# Patient Record
Sex: Male | Born: 1950 | Race: White | Hispanic: No | Smoking: Current every day smoker
Health system: Southern US, Community
[De-identification: ages and names within clinical notes are randomized; demographics above are authoritative.]

## PROBLEM LIST (undated history)

## (undated) DIAGNOSIS — E7800 Pure hypercholesterolemia, unspecified: Secondary | ICD-10-CM

## (undated) DIAGNOSIS — I10 Essential (primary) hypertension: Secondary | ICD-10-CM

## (undated) DIAGNOSIS — I219 Acute myocardial infarction, unspecified: Secondary | ICD-10-CM

## (undated) DIAGNOSIS — I4891 Unspecified atrial fibrillation: Secondary | ICD-10-CM

## (undated) HISTORY — PX: COLONOSCOPY: SHX174

## (undated) HISTORY — PX: CATARACT EXTRACTION, BILATERAL: SHX1313

## (undated) HISTORY — DX: Essential (primary) hypertension: I10

## (undated) HISTORY — DX: Acute myocardial infarction, unspecified: I21.9

## (undated) HISTORY — DX: Pure hypercholesterolemia, unspecified: E78.00

## (undated) HISTORY — DX: Unspecified atrial fibrillation: I48.91

## (undated) HISTORY — PX: NECK SURGERY: SHX720

## (undated) HISTORY — PX: CARDIAC CATHETERIZATION: SHX172

## (undated) HISTORY — PX: BACK SURGERY: SHX140

---

## 2016-10-15 ENCOUNTER — Encounter (INDEPENDENT_AMBULATORY_CARE_PROVIDER_SITE_OTHER): Payer: Self-pay | Admitting: Internal Medicine

## 2016-10-15 ENCOUNTER — Encounter (INDEPENDENT_AMBULATORY_CARE_PROVIDER_SITE_OTHER): Payer: Self-pay

## 2016-10-27 ENCOUNTER — Other Ambulatory Visit (INDEPENDENT_AMBULATORY_CARE_PROVIDER_SITE_OTHER): Payer: Self-pay | Admitting: Internal Medicine

## 2016-10-27 ENCOUNTER — Encounter (INDEPENDENT_AMBULATORY_CARE_PROVIDER_SITE_OTHER): Payer: Self-pay | Admitting: *Deleted

## 2016-10-27 ENCOUNTER — Encounter (INDEPENDENT_AMBULATORY_CARE_PROVIDER_SITE_OTHER): Payer: Self-pay | Admitting: Internal Medicine

## 2016-10-27 ENCOUNTER — Ambulatory Visit (INDEPENDENT_AMBULATORY_CARE_PROVIDER_SITE_OTHER): Payer: No Typology Code available for payment source | Admitting: Internal Medicine

## 2016-10-27 VITALS — BP 124/64 | HR 60 | Temp 98.3°F | Ht 70.0 in | Wt 184.0 lb

## 2016-10-27 DIAGNOSIS — K625 Hemorrhage of anus and rectum: Secondary | ICD-10-CM

## 2016-10-27 DIAGNOSIS — K6289 Other specified diseases of anus and rectum: Secondary | ICD-10-CM

## 2016-10-27 DIAGNOSIS — I219 Acute myocardial infarction, unspecified: Secondary | ICD-10-CM

## 2016-10-27 DIAGNOSIS — I10 Essential (primary) hypertension: Secondary | ICD-10-CM

## 2016-10-27 DIAGNOSIS — I4891 Unspecified atrial fibrillation: Secondary | ICD-10-CM

## 2016-10-27 DIAGNOSIS — E7800 Pure hypercholesterolemia, unspecified: Secondary | ICD-10-CM

## 2016-10-27 HISTORY — DX: Essential (primary) hypertension: I10

## 2016-10-27 HISTORY — DX: Acute myocardial infarction, unspecified: I21.9

## 2016-10-27 HISTORY — DX: Unspecified atrial fibrillation: I48.91

## 2016-10-27 HISTORY — DX: Pure hypercholesterolemia, unspecified: E78.00

## 2016-10-27 NOTE — Patient Instructions (Signed)
The risks and benefits such as perforation, bleeding, and infection were reviewed with the patient and is agreeable. 

## 2016-10-27 NOTE — Telephone Encounter (Signed)
Patient needs trilyte 

## 2016-10-27 NOTE — Telephone Encounter (Signed)
This encounter was created in error - please disregard.

## 2016-10-27 NOTE — Progress Notes (Addendum)
   Subjective:    Patient ID: Bradley Mullins, male    DOB: October 31, 1950, 66 y.o.   MRN: [id number]  HPI Referred by Dr Merleen Milliner for change in stool x 1 month. He says he is having problems with his BMs. It hurts when he wipes. He has seen blood in his stool but not recently. Last blood was about 3 weeks ago. Stools are normal size.  His last colonoscopy was in 2011 ago by Dr. Aleene Davidson. 3 polyps from sigmoid.  Biopsy: Submucosal lipoma He has a BM x 1-2 a day. Normal size. Stools are brown in color.  Using a stool softener twice a day.  Has lost about 10 pounds over the past year. Weight loss was intentional per patient. Wife states weight loss was not intentional.  Mother had colon polyps.     05/20/2016  H and H: 14.9 and 44.4  Atrial fib and maintained on Xarelto.  No family hx of colon cancer.  Review of Systems  Past Medical History:  Diagnosis Date  . Acute myocardial infarction 10/27/2016   2012 with 3 stents  . Atrial fibrillation (HCC) 10/27/2016  . Essential hypertension, benign 10/27/2016  . High cholesterol 10/27/2016    No past surgical history on file.  Allergies no known allergies  No current outpatient prescriptions on file prior to visit.   No current facility-administered medications on file prior to visit.          Objective:   Physical Exam Blood pressure 124/64, pulse 60, temperature 98.3 F (36.8 C), height 5\' 10"  (1.778 m), weight 184 lb (83.5 kg). Alert and oriented. Skin warm and dry. Oral mucosa is moist.   . Sclera anicteric, conjunctivae is pink. Thyroid not enlarged. No cervical lymphadenopathy. Lungs clear. Heart regular rate and rhythm.  Abdomen is soft. Bowel sounds are positive. No hepatomegaly. No abdominal masses felt. No tenderness.  No edema to lower extremities.  Stool brown and guaiac positive.         Assessment & Plan:  Change in stool. Rectal bleeding. Colonic neoplasm needs to be ruled out. Colonoscopy. The risks and benefits such as  perforation, bleeding, and infection were reviewed with the patient and is agreeable.

## 2016-10-28 ENCOUNTER — Telehealth (INDEPENDENT_AMBULATORY_CARE_PROVIDER_SITE_OTHER): Payer: Self-pay | Admitting: *Deleted

## 2016-10-28 DIAGNOSIS — K6289 Other specified diseases of anus and rectum: Secondary | ICD-10-CM | POA: Insufficient documentation

## 2016-10-28 DIAGNOSIS — K625 Hemorrhage of anus and rectum: Secondary | ICD-10-CM | POA: Insufficient documentation

## 2016-10-28 NOTE — Telephone Encounter (Signed)
Per Dr Daryel November it is ok for patient to stop Xarelto & ASA 2 days prior to TCS sch'd 11/12/16, patient aware

## 2016-10-31 ENCOUNTER — Encounter (INDEPENDENT_AMBULATORY_CARE_PROVIDER_SITE_OTHER): Payer: Self-pay

## 2016-11-12 ENCOUNTER — Encounter (HOSPITAL_COMMUNITY): Payer: Self-pay | Admitting: *Deleted

## 2016-11-12 ENCOUNTER — Encounter (INDEPENDENT_AMBULATORY_CARE_PROVIDER_SITE_OTHER): Payer: Self-pay | Admitting: Internal Medicine

## 2016-11-12 ENCOUNTER — Encounter (HOSPITAL_COMMUNITY)
Admission: RE | Disposition: A | Discharge status: Home / Self Care | Payer: Self-pay | Source: Ambulatory Visit | Attending: Internal Medicine

## 2016-11-12 ENCOUNTER — Ambulatory Visit (HOSPITAL_COMMUNITY)
Admission: RE | Admit: 2016-11-12 | Discharge: 2016-11-12 | Disposition: A | Discharge status: Home / Self Care | Payer: No Typology Code available for payment source | Source: Ambulatory Visit | Attending: Internal Medicine | Admitting: Internal Medicine

## 2016-11-12 DIAGNOSIS — Z7982 Long term (current) use of aspirin: Secondary | ICD-10-CM | POA: Insufficient documentation

## 2016-11-12 DIAGNOSIS — I4891 Unspecified atrial fibrillation: Secondary | ICD-10-CM | POA: Insufficient documentation

## 2016-11-12 DIAGNOSIS — K644 Residual hemorrhoidal skin tags: Secondary | ICD-10-CM | POA: Diagnosis not present

## 2016-11-12 DIAGNOSIS — I252 Old myocardial infarction: Secondary | ICD-10-CM | POA: Diagnosis not present

## 2016-11-12 DIAGNOSIS — I10 Essential (primary) hypertension: Secondary | ICD-10-CM | POA: Insufficient documentation

## 2016-11-12 DIAGNOSIS — E7800 Pure hypercholesterolemia, unspecified: Secondary | ICD-10-CM | POA: Insufficient documentation

## 2016-11-12 DIAGNOSIS — K602 Anal fissure, unspecified: Secondary | ICD-10-CM | POA: Diagnosis not present

## 2016-11-12 DIAGNOSIS — K6289 Other specified diseases of anus and rectum: Secondary | ICD-10-CM

## 2016-11-12 DIAGNOSIS — K5730 Diverticulosis of large intestine without perforation or abscess without bleeding: Secondary | ICD-10-CM | POA: Insufficient documentation

## 2016-11-12 DIAGNOSIS — Z79899 Other long term (current) drug therapy: Secondary | ICD-10-CM | POA: Insufficient documentation

## 2016-11-12 DIAGNOSIS — Z7901 Long term (current) use of anticoagulants: Secondary | ICD-10-CM | POA: Insufficient documentation

## 2016-11-12 DIAGNOSIS — Z955 Presence of coronary angioplasty implant and graft: Secondary | ICD-10-CM | POA: Insufficient documentation

## 2016-11-12 DIAGNOSIS — K921 Melena: Secondary | ICD-10-CM | POA: Diagnosis not present

## 2016-11-12 DIAGNOSIS — K625 Hemorrhage of anus and rectum: Secondary | ICD-10-CM

## 2016-11-12 DIAGNOSIS — F17210 Nicotine dependence, cigarettes, uncomplicated: Secondary | ICD-10-CM | POA: Insufficient documentation

## 2016-11-12 HISTORY — PX: COLONOSCOPY: SHX5424

## 2016-11-12 SURGERY — COLONOSCOPY
Anesthesia: Moderate Sedation

## 2016-11-12 MED ORDER — MIDAZOLAM HCL 5 MG/5ML IJ SOLN
INTRAMUSCULAR | Status: DC | PRN
  Administered 2016-11-12 (×2): 2 mg via INTRAVENOUS
  Administered 2016-11-12 (×2): 1 mg via INTRAVENOUS

## 2016-11-12 MED ORDER — MIDAZOLAM HCL 5 MG/5ML IJ SOLN
INTRAMUSCULAR | Status: AC
  Filled 2016-11-12: qty 10

## 2016-11-12 MED ORDER — MEPERIDINE HCL 50 MG/ML IJ SOLN
INTRAMUSCULAR | Status: DC | PRN
  Administered 2016-11-12 (×2): 25 mg via INTRAVENOUS

## 2016-11-12 MED ORDER — LIDOCAINE HCL 2 % EX GEL
CUTANEOUS | Status: AC
  Filled 2016-11-12: qty 30

## 2016-11-12 MED ORDER — LIDOCAINE HCL 2 % EX GEL
CUTANEOUS | Status: DC | PRN
  Administered 2016-11-12: 1 via TOPICAL

## 2016-11-12 MED ORDER — SIMETHICONE 40 MG/0.6ML PO SUSP
ORAL | Status: DC | PRN
  Administered 2016-11-12: 13:00:00

## 2016-11-12 MED ORDER — DILTIAZEM GEL 2 %: 1.0000 "application " | Freq: Two times a day (BID) | CUTANEOUS | 1 refills | Status: AC

## 2016-11-12 MED ORDER — SODIUM CHLORIDE 0.9 % IV SOLN
INTRAVENOUS | Status: DC
  Administered 2016-11-12: 12:00:00 via INTRAVENOUS

## 2016-11-12 MED ORDER — MEPERIDINE HCL 50 MG/ML IJ SOLN
INTRAMUSCULAR | Status: AC
  Filled 2016-11-12: qty 1

## 2016-11-12 NOTE — Discharge Instructions (Signed)
Anal Fissure, Adult An anal fissure is a small tear or crack in the skin around the opening of the butt (anus).Bleeding from the tear or crack usually stops on its own within a few minutes. The bleeding may happen every time you poop (have a bowel movement) until the tear or crack heals. Follow these instructions at home: Eating and drinking   Avoid bananas and dairy products. These foods can make it hard to poop.  Drink enough fluid to keep your pee (urine) clear or pale yellow.  Eat a lot of fruit, whole grains, and vegetables. General instructions   Keep the butt area as clean and dry as you can.  Take a warm water bath (sitz bath) as told by your doctor. Do not use soap.  Take over-the-counter and prescription medicines only as told by your doctor.  Use creams or ointments only as told by your doctor.  Keep all follow-up visits as told by your doctor. This is important. Contact a doctor if:  You have more bleeding.  You have a fever.  You have watery poop (diarrhea) that is mixed with blood.  You have pain.  You problem gets worse, not better. This information is not intended to replace advice given to you by your health care provider. Make sure you discuss any questions you have with your health care provider. Document Released: 04/30/2011 Document Revised: 02/07/2016 Document Reviewed: 11/27/2014 Elsevier Interactive Patient Education  2017 [employer]. Resume usual medications including aspirin and Xarelto as before. High fiber diet. Diltiazem gel apply to anal canal twice daily as directed. Benefiber or equivalent 4 g by mouth daily at bedtime. Sitz bath daily. Office visit in one month.     High-Fiber Diet Fiber, also called dietary fiber, is a type of carbohydrate found in fruits, vegetables, whole grains, and beans. A high-fiber diet can have many health benefits. Your health care provider may recommend a high-fiber diet to help:  Prevent  constipation. Fiber can make your bowel movements more regular.  Lower your cholesterol.  Relieve hemorrhoids, uncomplicated diverticulosis, or irritable bowel syndrome.  Prevent overeating as part of a weight-loss plan.  Prevent heart disease, type 2 diabetes, and certain cancers. What is my plan? The recommended daily intake of fiber includes:  38 grams for men under age 73.  30 grams for men over age 74.  25 grams for women under age 9.  21 grams for women over age 63. You can get the recommended daily intake of dietary fiber by eating a variety of fruits, vegetables, grains, and beans. Your health care provider may also recommend a fiber supplement if it is not possible to get enough fiber through your diet. What do I need to know about a high-fiber diet?  Fiber supplements have not been widely studied for their effectiveness, so it is better to get fiber through food sources.  Always check the fiber content on thenutrition facts label of any prepackaged food. Look for foods that contain at least 5 grams of fiber per serving.  Ask your dietitian if you have questions about specific foods that are related to your condition, especially if those foods are not listed in the following section.  Increase your daily fiber consumption gradually. Increasing your intake of dietary fiber too quickly may cause bloating, cramping, or gas.  Drink plenty of water. Water helps you to digest fiber. What foods can I eat? Grains  Whole-grain breads. Multigrain cereal. Oats and oatmeal. Brown rice. Barley. Bulgur wheat.  Millet. Bran muffins. Popcorn. Rye wafer crackers. Vegetables  Sweet potatoes. Spinach. Kale. Artichokes. Cabbage. Broccoli. Green peas. Carrots. Squash. Fruits  Berries. Pears. Apples. Oranges. Avocados. Prunes and raisins. Dried figs. Meats and Other Protein Sources  Navy, kidney, pinto, and soy beans. Split peas. Lentils. Nuts and seeds. Dairy  Fiber-fortified  yogurt. Beverages  Fiber-fortified soy milk. Fiber-fortified orange juice. Other  Fiber bars. The items listed above may not be a complete list of recommended foods or beverages. Contact your dietitian for more options.  What foods are not recommended? Grains  White bread. Pasta made with refined flour. White rice. Vegetables  Fried potatoes. Canned vegetables. Well-cooked vegetables. Fruits  Fruit juice. Cooked, strained fruit. Meats and Other Protein Sources  Fatty cuts of meat. Fried Environmental education officer or fried fish. Dairy  Milk. Yogurt. Cream cheese. Sour cream. Beverages  Soft drinks. Other  Cakes and pastries. Butter and oils. The items listed above may not be a complete list of foods and beverages to avoid. Contact your dietitian for more information.  What are some tips for including high-fiber foods in my diet?  Eat a wide variety of high-fiber foods.  Make sure that half of all grains consumed each day are whole grains.  Replace breads and cereals made from refined flour or white flour with whole-grain breads and cereals.  Replace white rice with brown rice, bulgur wheat, or millet.  Start the day with a breakfast that is high in fiber, such as a cereal that contains at least 5 grams of fiber per serving.  Use beans in place of meat in soups, salads, or pasta.  Eat high-fiber snacks, such as berries, raw vegetables, nuts, or popcorn. This information is not intended to replace advice given to you by your health care provider. Make sure you discuss any questions you have with your health care provider. Document Released: 09/01/2005 Document Revised: 02/07/2016 Document Reviewed: 02/14/2014 Elsevier Interactive Patient Education  2017 [employer]  Colonoscopy, Adult, Care After This sheet gives you information about how to care for yourself after your procedure. Your health care provider may also give you more specific instructions. If you have problems or questions, contact  your health care provider. What can I expect after the procedure? After the procedure, it is common to have:  A small amount of blood in your stool for 24 hours after the procedure.  Some gas.  Mild abdominal cramping or bloating. Follow these instructions at home: General instructions    For the first 24 hours after the procedure:  Do not drive or use machinery.  Do not sign important documents.  Do not drink alcohol.  Do your regular daily activities at a slower pace than normal.  Eat soft, easy-to-digest foods.  Rest often.  Take over-the-counter or prescription medicines only as told by your health care provider.  It is up to you to get the results of your procedure. Ask your health care provider, or the department performing the procedure, when your results will be ready. Relieving cramping and bloating   Try walking around when you have cramps or feel bloated.  Apply heat to your abdomen as told by your health care provider. Use a heat source that your health care provider recommends, such as a moist heat pack or a heating pad.  Place a towel between your skin and the heat source.  Leave the heat on for 20-30 minutes.  Remove the heat if your skin turns bright red. This is especially important if you  are unable to feel pain, heat, or cold. You may have a greater risk of getting burned. Eating and drinking   Drink enough fluid to keep your urine clear or pale yellow.  Resume your normal diet as instructed by your health care provider. Avoid heavy or fried foods that are hard to digest.  Avoid drinking alcohol for as long as instructed by your health care provider. Contact a health care provider if:  You have blood in your stool 2-3 days after the procedure. Get help right away if:  You have more than a small spotting of blood in your stool.  You pass large blood clots in your stool.  Your abdomen is swollen.  You have nausea or vomiting.  You have a  fever.  You have increasing abdominal pain that is not relieved with medicine. This information is not intended to replace advice given to you by your health care provider. Make sure you discuss any questions you have with your health care provider. Document Released: 04/15/2004 Document Revised: 05/26/2016 Document Reviewed: 11/13/2015 Elsevier Interactive Patient Education  2017 [employer].

## 2016-11-12 NOTE — H&P (Signed)
Bradley Mullins is an 66 y.o. male.   Chief Complaint: Patient is here for colonoscopy. HPI: 66 year old Caucasian male who presents with few months history of hematochezia. He also complains of painful defecation. He states he had I&D for perirectal boil about 6 months ago. He denies chronic diarrhea or constipation. States he lost few pounds while he was on medication for migraine but he has stopped this medication. Last colonoscopy was in July 2011 with removal of small polyp. It was lipoma. He has been off anticoagulant for 2 days. Family history is negative for CRC but his mother had multiple colonic polyps.  Past Medical History:  Diagnosis Date  . Acute myocardial infarction 10/27/2016   2012 with 3 stents  . Atrial fibrillation (HCC) 10/27/2016  . Essential hypertension, benign 10/27/2016  . High cholesterol 10/27/2016    Past Surgical History:  Procedure Laterality Date  . BACK SURGERY    . CARDIAC CATHETERIZATION     2012 with 3 stents  . CATARACT EXTRACTION, BILATERAL    . COLONOSCOPY    . NECK SURGERY     25 yrs ago    Family History  Problem Relation Age of Onset  . Colon cancer Neg Hx    Social History:  reports that he has been smoking Cigarettes.  He has a 38.00 pack-year smoking history. He has never used smokeless tobacco. He reports that he drinks alcohol. He reports that he does not use drugs.  Allergies: No Known Allergies  Medications Prior to Admission  Medication Sig Dispense Refill  . amLODipine (NORVASC) 5 MG tablet Take 5 mg by mouth daily.    Marland Kitchen aspirin EC 81 MG tablet Take 81 mg by mouth daily.    Marland Kitchen atorvastatin (LIPITOR) 40 MG tablet Take 40 mg by mouth at bedtime.    Marland Kitchen ibuprofen (ADVIL,MOTRIN) 200 MG tablet Take 400 mg by mouth every 8 (eight) hours as needed for mild pain or moderate pain.    Marland Kitchen lisinopril (PRINIVIL,ZESTRIL) 40 MG tablet Take 40 mg by mouth daily.    . rivaroxaban (XARELTO) 20 MG TABS tablet Take 20 mg by mouth daily with supper.    .  sotalol (BETAPACE) 80 MG tablet Take 80 mg by mouth 2 (two) times daily.      No results found for this or any previous visit (from the past 48 hour(s)). No results found.  ROS  Blood pressure 138/64, pulse (!) 50, temperature 97.8 F (36.6 C), temperature source Oral, resp. rate 17, height 5\' 10"  (1.778 m), weight 184 lb (83.5 kg), SpO2 100 %. Physical Exam  Constitutional: He appears well-developed and well-nourished.  HENT:  Mouth/Throat: Oropharynx is clear and moist.  Eyes: Conjunctivae are normal. No scleral icterus.  Neck: No thyromegaly present.  Cardiovascular: Normal rate, regular rhythm and normal heart sounds.   No murmur heard. Respiratory: Effort normal and breath sounds normal.  GI:  Abdomen is symmetrical soft and nontender without organomegaly or masses.  Lymphadenopathy:    He has no cervical adenopathy.     Assessment/Plan Hematochezia and painful defecation. Diagnostic colonoscopy.  Lionel December, MD 11/12/2016, 12:58 PM

## 2016-11-12 NOTE — Op Note (Signed)
[facility] Patient Name: Bradley Mullins Procedure Date: 11/12/2016 12:44 PM MRN: [id number] Date of Birth: 18-Feb-1951 Attending MD: Lionel December , MD CSN: [id number] Age: 66 Admit Type: Outpatient Procedure:                Colonoscopy Indications:              Hematochezia, Rectal pain Providers:                Lionel December, MD, Loma Messing B. Patsy Lager, RN, Dyann Ruddle Referring MD:             Arlina Robes, MD Medicines:                Xylocaine Jelly applied to anal canal. Meperidine                            50 mg IV, Midazolam 6 mg IV Complications:            No immediate complications. Estimated Blood Loss:     Estimated blood loss: none. Procedure:                Pre-Anesthesia Assessment:                           - Prior to the procedure, a History and Physical                            was performed, and patient medications and                            allergies were reviewed. The patient's tolerance of                            previous anesthesia was also reviewed. The risks                            and benefits of the procedure and the sedation                            options and risks were discussed with the patient.                            All questions were answered, and informed consent                            was obtained. Prior Anticoagulants: The patient                            last took aspirin 3 days and Xarelto (rivaroxaban)                            2 days prior to the procedure. ASA Grade  Assessment: II - A patient with mild systemic                            disease. After reviewing the risks and benefits,                            the patient was deemed in satisfactory condition to                            undergo the procedure.                           After obtaining informed consent, the colonoscope                            was passed under direct vision.  Throughout the                            procedure, the patient's blood pressure, pulse, and                            oxygen saturations were monitored continuously. The                            [id number] ([id number]) was introduced through the                            anus and advanced to the the cecum, identified by                            appendiceal orifice and ileocecal valve. The                            colonoscopy was performed without difficulty. The                            patient tolerated the procedure well. The quality                            of the bowel preparation was good. The ileocecal                            valve, appendiceal orifice, and rectum were                            photographed. Scope In: 1:10:51 PM Scope Out: 1:25:39 PM Scope Withdrawal Time: 0 hours 5 minutes 13 seconds  Total Procedure Duration: 0 hours 14 minutes 48 seconds  Findings:      The digital rectal exam findings include {skipcreased sphincter tone}.      A few medium-mouthed diverticula were found in the sigmoid colon.      The exam was otherwise normal throughout the examined colon.      External hemorrhoids were found during retroflexion. The hemorrhoids       were small.  A small anal fissure was found in the anal canal. Impression:               - {skipcreased sphincter tone} found on digital                            rectal exam.                           - Diverticulosis in the sigmoid colon.                           - External hemorrhoids.                           - Anal fissure.                           - No specimens collected. Moderate Sedation:      Moderate (conscious) sedation was administered by the endoscopy nurse       and supervised by the endoscopist. The following parameters were       monitored: oxygen saturation, heart rate, blood pressure, CO2       capnography and response to care. Total physician intraservice time was       21  minutes. Recommendation:           - Patient has a contact number available for                            emergencies. The signs and symptoms of potential                            delayed complications were discussed with the                            patient. Return to normal activities tomorrow.                            Written discharge instructions were provided to the                            patient.                           - High fiber diet today.                           - Continue present medications.                           - Resume aspirin today and Xarelto (rivaroxaban)                            today at prior doses.                           - diltiazem gel 2% to be applied to anal canal  twice a day.                           - office visit in one month.                           - Repeat colonoscopy in 10 years for screening                            purposes. Procedure Code(s):        --- Professional ---                           [phone], Colonoscopy, flexible; diagnostic, including                            collection of specimen(s) by brushing or washing,                            when performed (separate procedure)                           99152, Moderate sedation services provided by the                            same physician or other qualified health care                            professional performing the diagnostic or                            therapeutic service that the sedation supports,                            requiring the presence of an independent trained                            observer to assist in the monitoring of the                            patient's level of consciousness and physiological                            status; initial 15 minutes of intraservice time,                            patient age 76 years or older Diagnosis Code(s):        --- Professional ---                            K64.4, Residual hemorrhoidal skin tags                           K60.2, Anal fissure, unspecified  K92.1, Melena (includes Hematochezia)                           K62.89, Other specified diseases of anus and rectum                           K57.30, Diverticulosis of large intestine without                            perforation or abscess without bleeding CPT copyright 2016 American Medical Association. All rights reserved. The codes documented in this report are preliminary and upon coder review may  be revised to meet current compliance requirements. Lionel December, MD Lionel December, MD 11/12/2016 1:37:58 PM This report has been signed electronically. Number of Addenda: 0

## 2016-11-12 NOTE — Progress Notes (Signed)
Marylen Ponto had a procedure at [facility] on   11/12/2016.  He may return to work on Friday 11/14/2016.

## 2016-11-14 ENCOUNTER — Encounter (HOSPITAL_COMMUNITY): Payer: Self-pay | Admitting: Internal Medicine

## 2016-12-10 ENCOUNTER — Ambulatory Visit (INDEPENDENT_AMBULATORY_CARE_PROVIDER_SITE_OTHER): Payer: No Typology Code available for payment source | Admitting: Internal Medicine

## 2020-07-06 ENCOUNTER — Emergency Department (HOSPITAL_COMMUNITY): Payer: No Typology Code available for payment source

## 2020-07-06 ENCOUNTER — Emergency Department (HOSPITAL_COMMUNITY)
Admission: EM | Admit: 2020-07-06 | Discharge: 2020-07-06 | Disposition: A | Discharge status: Home / Self Care | Payer: No Typology Code available for payment source | Attending: Emergency Medicine | Admitting: Emergency Medicine

## 2020-07-06 ENCOUNTER — Encounter (HOSPITAL_COMMUNITY): Payer: Self-pay | Admitting: Emergency Medicine

## 2020-07-06 ENCOUNTER — Other Ambulatory Visit: Payer: Self-pay

## 2020-07-06 DIAGNOSIS — Z7982 Long term (current) use of aspirin: Secondary | ICD-10-CM | POA: Diagnosis not present

## 2020-07-06 DIAGNOSIS — F17210 Nicotine dependence, cigarettes, uncomplicated: Secondary | ICD-10-CM | POA: Insufficient documentation

## 2020-07-06 DIAGNOSIS — D649 Anemia, unspecified: Secondary | ICD-10-CM

## 2020-07-06 DIAGNOSIS — R0602 Shortness of breath: Secondary | ICD-10-CM

## 2020-07-06 DIAGNOSIS — Z20822 Contact with and (suspected) exposure to covid-19: Secondary | ICD-10-CM | POA: Insufficient documentation

## 2020-07-06 DIAGNOSIS — I10 Essential (primary) hypertension: Secondary | ICD-10-CM | POA: Diagnosis not present

## 2020-07-06 DIAGNOSIS — Z7901 Long term (current) use of anticoagulants: Secondary | ICD-10-CM | POA: Diagnosis not present

## 2020-07-06 DIAGNOSIS — R42 Dizziness and giddiness: Secondary | ICD-10-CM | POA: Diagnosis not present

## 2020-07-06 DIAGNOSIS — Z79899 Other long term (current) drug therapy: Secondary | ICD-10-CM | POA: Insufficient documentation

## 2020-07-06 LAB — CBC WITH DIFFERENTIAL/PLATELET
Abs Immature Granulocytes: 0.03 10*3/uL (ref 0.00–0.07)
Basophils Absolute: 0.1 10*3/uL (ref 0.0–0.1)
Basophils Relative: 1 %
Eosinophils Absolute: 0.6 10*3/uL — ABNORMAL HIGH (ref 0.0–0.5)
Eosinophils Relative: 7 %
HCT: 32.8 % — ABNORMAL LOW (ref 39.0–52.0)
Hemoglobin: 8.7 g/dL — ABNORMAL LOW (ref 13.0–17.0)
Immature Granulocytes: 0 %
Lymphocytes Relative: 23 %
Lymphs Abs: 2.1 10*3/uL (ref 0.7–4.0)
MCH: 18.3 pg — ABNORMAL LOW (ref 26.0–34.0)
MCHC: 26.5 g/dL — ABNORMAL LOW (ref 30.0–36.0)
MCV: 69.1 fL — ABNORMAL LOW (ref 80.0–100.0)
Monocytes Absolute: 0.8 10*3/uL (ref 0.1–1.0)
Monocytes Relative: 9 %
Neutro Abs: 5.2 10*3/uL (ref 1.7–7.7)
Neutrophils Relative %: 60 %
Platelets: 286 10*3/uL (ref 150–400)
RBC: 4.75 MIL/uL (ref 4.22–5.81)
RDW: 20.6 % — ABNORMAL HIGH (ref 11.5–15.5)
WBC: 8.9 10*3/uL (ref 4.0–10.5)
nRBC: 0 % (ref 0.0–0.2)

## 2020-07-06 LAB — BASIC METABOLIC PANEL
Anion gap: 9 (ref 5–15)
BUN: 13 mg/dL (ref 8–23)
CO2: 27 mmol/L (ref 22–32)
Calcium: 9.1 mg/dL (ref 8.9–10.3)
Chloride: 100 mmol/L (ref 98–111)
Creatinine, Ser: 0.61 mg/dL (ref 0.61–1.24)
GFR, Estimated: >60 mL/min (ref >60)
Glucose, Bld: 120 mg/dL — ABNORMAL HIGH (ref 70–99)
Potassium: 3.9 mmol/L (ref 3.5–5.1)
Sodium: 136 mmol/L (ref 135–145)

## 2020-07-06 LAB — RESPIRATORY PANEL BY RT PCR (FLU A&B, COVID)
Influenza A by PCR: NEGATIVE
Influenza B by PCR: NEGATIVE
SARS Coronavirus 2 by RT PCR: NEGATIVE

## 2020-07-06 LAB — D-DIMER, QUANTITATIVE: D-Dimer, Quant: 0.65 ug/mL-FEU — ABNORMAL HIGH (ref 0.00–0.50)

## 2020-07-06 LAB — TROPONIN I (HIGH SENSITIVITY)
Troponin I (High Sensitivity): 4 ng/L (ref <18)
Troponin I (High Sensitivity): 4 ng/L (ref <18)

## 2020-07-06 LAB — BRAIN NATRIURETIC PEPTIDE: B Natriuretic Peptide: 367 pg/mL — ABNORMAL HIGH (ref 0.0–100.0)

## 2020-07-06 MED ORDER — FUROSEMIDE 10 MG/ML IJ SOLN
40.0000 mg | Freq: Once | INTRAMUSCULAR | Status: AC
  Administered 2020-07-06: 40 mg via INTRAVENOUS
  Filled 2020-07-06: qty 4

## 2020-07-06 MED ORDER — IOHEXOL 350 MG/ML SOLN
100.0000 mL | Freq: Once | INTRAVENOUS | Status: AC | PRN
  Administered 2020-07-06: 100 mL via INTRAVENOUS

## 2020-07-06 NOTE — ED Notes (Signed)
Pt to CT

## 2020-07-06 NOTE — ED Provider Notes (Signed)
[facility] EMERGENCY DEPARTMENT Provider Note   CSN: [id number] Arrival date & time: 07/06/20  1006     History Chief Complaint  Patient presents with  . Abnormal Lab    Bradley Mullins is a 69 y.o. male.  He has a history of A. fib and MI and is on anticoagulation.  Smoker.  Complaining of 2 weeks of increased shortness of breath especially with exertion.  No fevers or chills.  No chest pain.  He had some blood work done few days ago and was called by his doctor saying he needed to come to the emergency department for an elevated D-dimer.  I reviewed the paperwork and his D-dimer was 0.58.  He also says he gets lightheaded when he stands up and when he exerts himself.  The history is provided by the patient.  Shortness of Breath Severity:  Moderate Onset quality:  Gradual Duration:  2 weeks Timing:  Intermittent Progression:  Worsening Chronicity:  New Context: activity   Relieved by:  None tried Worsened by:  Activity Ineffective treatments:  Rest Associated symptoms: no abdominal pain, no chest pain, no cough, no fever, no neck pain, no rash, no sore throat, no sputum production, no vomiting and no wheezing   Risk factors: tobacco use        Past Medical History:  Diagnosis Date  . Acute myocardial infarction [facility]) 10/27/2016   2012 with 3 stents  . Atrial fibrillation (HCC) 10/27/2016  . Essential hypertension, benign 10/27/2016  . High cholesterol 10/27/2016    Patient Active Problem List   Diagnosis Date Noted  . Rectal pain 10/28/2016  . Rectal bleeding 10/28/2016  . Essential hypertension, benign 10/27/2016  . High cholesterol 10/27/2016  . Acute myocardial infarction (HCC) 10/27/2016  . Atrial fibrillation (HCC) 10/27/2016    Past Surgical History:  Procedure Laterality Date  . BACK SURGERY    . CARDIAC CATHETERIZATION     2012 with 3 stents  . CATARACT EXTRACTION, BILATERAL    . COLONOSCOPY    . COLONOSCOPY N/A 11/12/2016   Procedure: COLONOSCOPY;   Surgeon: Malissa Hippo, MD;  Location: [facility];  Service: Endoscopy;  Laterality: N/A;  12:45  . NECK SURGERY     25 yrs ago       Family History  Problem Relation Age of Onset  . Colon cancer Neg Hx     Social History   Tobacco Use  . Smoking status: Current Every Day Smoker    Packs/day: 1.00    Years: 38.00    Pack years: 38.00    Types: Cigarettes  . Smokeless tobacco: Never Used  Substance Use Topics  . Alcohol use: Yes    Comment: occasional beer.  . Drug use: No    Home Medications Prior to Admission medications   Medication Sig Start Date End Date Taking? Authorizing Provider  amLODipine (NORVASC) 5 MG tablet Take 5 mg by mouth daily.    [provider]  aspirin EC 81 MG tablet Take 81 mg by mouth daily.    [provider]  atorvastatin (LIPITOR) 40 MG tablet Take 40 mg by mouth at bedtime.    [provider]  diltiazem 2 % GEL Apply 1 application topically 2 (two) times daily. 11/12/16   Malissa Hippo, MD  ibuprofen (ADVIL,MOTRIN) 200 MG tablet Take 400 mg by mouth every 8 (eight) hours as needed for mild pain or moderate pain.    [provider]  lisinopril (PRINIVIL,ZESTRIL) 40  MG tablet Take 40 mg by mouth daily.    [provider]  rivaroxaban (XARELTO) 20 MG TABS tablet Take 20 mg by mouth daily with supper.    [provider]  sotalol (BETAPACE) 80 MG tablet Take 80 mg by mouth 2 (two) times daily.    [provider]    Allergies    Patient has no known allergies.  Review of Systems   Review of Systems  Constitutional: Negative for fever.  HENT: Negative for sore throat.   Eyes: Negative for visual disturbance.  Respiratory: Positive for shortness of breath. Negative for cough, sputum production and wheezing.   Cardiovascular: Negative for chest pain.  Gastrointestinal: Negative for abdominal pain and vomiting.  Genitourinary: Negative for dysuria.  Musculoskeletal: Negative for  neck pain.  Skin: Negative for rash.  Neurological: Positive for light-headedness.    Physical Exam Updated Vital Signs Temp 98 F (36.7 C) (Oral)   Resp 20   Ht 5\' 10"  (1.778 m)   Wt 81.6 kg   BMI 25.83 kg/m   Physical Exam Vitals and nursing note reviewed.  Constitutional:      General: He is not in acute distress.    Appearance: Normal appearance. He is well-developed.  HENT:     Head: Normocephalic and atraumatic.  Eyes:     Conjunctiva/sclera: Conjunctivae normal.  Cardiovascular:     Rate and Rhythm: Normal rate. Rhythm irregular.     Heart sounds: No murmur heard.   Pulmonary:     Effort: Pulmonary effort is normal. No respiratory distress.     Breath sounds: Normal breath sounds.  Abdominal:     Palpations: Abdomen is soft.     Tenderness: There is no abdominal tenderness.  Musculoskeletal:        General: Normal range of motion.     Cervical back: Neck supple.     Right lower leg: No edema.     Left lower leg: No edema.  Skin:    General: Skin is warm and dry.     Capillary Refill: Capillary refill takes less than 2 seconds.  Neurological:     General: No focal deficit present.     Mental Status: He is alert.     ED Results / Procedures / Treatments   Labs (all labs ordered are listed, but only abnormal results are displayed) Labs Reviewed  BASIC METABOLIC PANEL - Abnormal; Notable for the following components:      Result Value   Glucose, Bld 120 (*)    All other components within normal limits  CBC WITH DIFFERENTIAL/PLATELET - Abnormal; Notable for the following components:   Hemoglobin 8.7 (*)    HCT 32.8 (*)    MCV 69.1 (*)    MCH 18.3 (*)    MCHC 26.5 (*)    RDW 20.6 (*)    Eosinophils Absolute 0.6 (*)    All other components within normal limits  BRAIN NATRIURETIC PEPTIDE - Abnormal; Notable for the following components:   B Natriuretic Peptide 367.0 (*)    All other components within normal limits  D-DIMER, QUANTITATIVE (NOT AT [facility])  - Abnormal; Notable for the following components:   D-Dimer, Quant 0.65 (*)    All other components within normal limits  RESPIRATORY PANEL BY RT PCR (FLU A&B, COVID)  TROPONIN I (HIGH SENSITIVITY)  TROPONIN I (HIGH SENSITIVITY)    EKG EKG Interpretation  Date/Time:  Friday July 06 2020 10:25:38 EDT Ventricular Rate:  126 PR Interval:  QRS Duration: 99 QT Interval:  332 QTC Calculation: 487 R Axis:   -63 Text Interpretation: Atrial fibrillation Ventricular premature complex Inferior infarct, old Consider anterior infarct No old tracing to compare Confirmed by Meridee Score [phone]) on 07/06/2020 10:35:10 AM   Radiology CT Angio Chest PE W/Cm &/Or Wo Cm  Result Date: 07/06/2020 CLINICAL DATA:  Pulmonary embolism, dizziness, weakness, cough EXAM: CT ANGIOGRAPHY CHEST WITH CONTRAST TECHNIQUE: Multidetector CT imaging of the chest was performed using the standard protocol during bolus administration of intravenous contrast. Multiplanar CT image reconstructions and MIPs were obtained to evaluate the vascular anatomy. CONTRAST:  OMNIPAQUE IOHEXOL 350 MG/ML SOLN COMPARISON:  None. FINDINGS: Cardiovascular: There is excellent opacification of the pulmonary arterial tree. No intraluminal filling defect to suggest acute pulmonary embolism. Central pulmonary arterial caliber is normal. Minimal coronary artery calcification. Distal right coronary artery has undergone stenting. Global cardiac size within normal limits. Mild left ventricular hypertrophy. No pericardial effusion. Mild atherosclerotic calcification is seen within the a aortic arch. Thoracic aorta is otherwise unremarkable Mediastinum/Nodes: Thyroid unremarkable. No pathologic mediastinal adenopathy. Esophagus unremarkable. Lungs/Pleura: Mild centrilobular emphysema. There is bronchial wall thickening noted diffusely in keeping with airway inflammation no confluent pulmonary infiltrates or focal pulmonary nodule. No pneumothorax  or pleural effusion. No central obstructing mass. Upper Abdomen: No acute abnormality. Musculoskeletal: Cervical fusion hardware partially visualized. No acute bone abnormality. Review of the MIP images confirms the above findings. IMPRESSION: Mild centrilobular emphysema.  Superimposed airway inflammation. No pulmonary embolism. Aortic Atherosclerosis (ICD10-I70.0) and Emphysema (ICD10-J43.9). Electronically Signed   By: Helyn Numbers MD   On: 07/06/2020 15:34   DG Chest Port 1 View  Result Date: 07/06/2020 CLINICAL DATA:  Shortness of breath. EXAM: PORTABLE CHEST 1 VIEW COMPARISON:  None. FINDINGS: Borderline enlargement of the cardiac silhouette. Both lungs are clear. No visible pleural effusions or pneumothorax. The visualized skeletal structures are unremarkable. IMPRESSION: 1. No acute cardiopulmonary disease. 2. Borderline cardiomegaly. Electronically Signed   By: Feliberto Harts MD   On: 07/06/2020 10:59    Procedures Procedures (including critical care time)  Medications Ordered in ED Medications  furosemide (LASIX) injection 40 mg (40 mg Intravenous Given 07/06/20 1230)  iohexol (OMNIPAQUE) 350 MG/ML injection 100 mL (100 mLs Intravenous Contrast Given 07/06/20 1515)    ED Course  I have reviewed the triage vital signs and the nursing notes.  Pertinent labs & imaging results that were available during my care of the patient were reviewed by me and considered in my medical decision making (see chart for details).  Clinical Course as of Jul 07 1807  Fri Jul 06, 2020  1048 Chest x-ray showing no acute infiltrates, interpreted by me   [MB]  1258 Labs reviewed he is anemic.  He said his cardiologist told him that when he was seen a few days ago.  When I asked him what the work-up for that is he said his cardiologist wanted him to get the D-dimer figured out first before he had more testing for that.  BNP elevated.  No prior history of cardiac disease.  Troponin not elevated.   Patient denies any melena or objective bleeding.   [MB]  1350 Discussed with Dr. Daryel November cardiology out of [place].  He appreciated that we are seeing him and said he will follow up the patient.  I let him know that the patient may ultimately leave before he gets his CAT scan PE study done.   [MB]    Clinical Course User  Index [MB] Terrilee Files, MD   MDM Rules/Calculators/A&P                         Janmichael Giraud was evaluated in Emergency Department on 07/06/2020 for the symptoms described in the history of present illness. He was evaluated in the context of the global COVID-19 pandemic, which necessitated consideration that the patient might be at risk for infection with the SARS-CoV-2 virus that causes COVID-19. Institutional protocols and algorithms that pertain to the evaluation of patients at risk for COVID-19 are in a state of rapid change based on information released by regulatory bodies including the CDC and federal and state organizations. These policies and algorithms were followed during the patient's care in the ED.  This patient complains of elevated D-dimer and mild shortness of breath; this involves an extensive number of treatment Options and is a complaint that carries with it a high risk of complications and Morbidity. The differential includes PE, pneumonia, ACS, pneumothorax, Covid  I ordered, reviewed and interpreted labs, which included CBC with normal white count, low hemoglobin of 8.7 (reviewed prior hemoglobin from cardiology visit and similar although appears to be new for patient) chemistries fairly normal other than mild elevation of glucose, BNP elevated at 367 with no priors, D-dimer mildly elevated, troponins flat I ordered medication IV Lasix with good diuresis I ordered imaging studies which included chest x-ray and I independently    visualized and interpreted imaging which showed cardiomegaly no frank edema Additional history obtained from patient's  wife Previous records obtained and reviewed in epic, no recent visits usually follows out of [place] I consulted patient's cardiologist Daryel November and discussed lab and imaging findings  Critical Interventions: None  After the interventions stated above, I reevaluated the patient and found patient to be minimally symptomatic.  His CT unfortunately took a while but does not show any evidence of PE but does show some element of COPD.  Recommended he closely follow-up with his PCP and cardiologist regarding his respiratory symptoms and his new anemia.  Recommended he return to the emergency department if he notices any melena or any other frank bleeding.   Final Clinical Impression(s) / ED Diagnoses Final diagnoses:  Shortness of breath  Anemia, unspecified type    Rx / DC Orders ED Discharge Orders    None       Terrilee Files, MD 07/06/20 1811

## 2020-07-06 NOTE — ED Triage Notes (Signed)
Pt reports dizziness/shortness of breath for last several weeks. Pt had blood drawn on Tuesday and reports was called and told had possible blood clot in lung. Pt currently taking xarelto or any known injury. Pt reports chest pressure. nad noted.

## 2020-07-06 NOTE — Discharge Instructions (Signed)
You were seen in the emergency department for evaluation of shortness of breath.  Your blood work showed you to be anemic.  There is no evidence of heart attack.  Chest x-ray did not show a pneumonia and your Covid testing was negative.  Please continue your regular medications.  Follow-up with your primary care doctor and cardiologist for further work-up.  Return the emergency department for any worsening or concerning symptoms

## 2020-07-30 ENCOUNTER — Ambulatory Visit (INDEPENDENT_AMBULATORY_CARE_PROVIDER_SITE_OTHER): Payer: No Typology Code available for payment source | Admitting: Gastroenterology

## 2020-08-24 DIAGNOSIS — I4901 Ventricular fibrillation: Secondary | ICD-10-CM

## 2020-08-24 DIAGNOSIS — K922 Gastrointestinal hemorrhage, unspecified: Secondary | ICD-10-CM

## 2020-08-24 DIAGNOSIS — I482 Chronic atrial fibrillation, unspecified: Secondary | ICD-10-CM

## 2020-08-24 DIAGNOSIS — J698 Pneumonitis due to inhalation of other solids and liquids: Secondary | ICD-10-CM

## 2020-08-24 DIAGNOSIS — J9621 Acute and chronic respiratory failure with hypoxia: Secondary | ICD-10-CM

## 2020-08-24 DIAGNOSIS — I509 Heart failure, unspecified: Secondary | ICD-10-CM

## 2020-08-25 DIAGNOSIS — I4901 Ventricular fibrillation: Secondary | ICD-10-CM

## 2020-08-25 DIAGNOSIS — J9621 Acute and chronic respiratory failure with hypoxia: Secondary | ICD-10-CM

## 2020-08-25 DIAGNOSIS — I509 Heart failure, unspecified: Secondary | ICD-10-CM

## 2020-08-25 DIAGNOSIS — K922 Gastrointestinal hemorrhage, unspecified: Secondary | ICD-10-CM

## 2020-08-25 DIAGNOSIS — J698 Pneumonitis due to inhalation of other solids and liquids: Secondary | ICD-10-CM

## 2020-08-25 DIAGNOSIS — I482 Chronic atrial fibrillation, unspecified: Secondary | ICD-10-CM

## 2020-08-26 ENCOUNTER — Inpatient Hospital Stay
Admission: AD | Admit: 2020-08-26 | Payer: No Typology Code available for payment source | Source: Other Acute Inpatient Hospital | Admitting: Pulmonary Disease

## 2020-08-26 DIAGNOSIS — K922 Gastrointestinal hemorrhage, unspecified: Secondary | ICD-10-CM | POA: Diagnosis not present

## 2020-08-26 DIAGNOSIS — I509 Heart failure, unspecified: Secondary | ICD-10-CM

## 2020-08-26 DIAGNOSIS — J698 Pneumonitis due to inhalation of other solids and liquids: Secondary | ICD-10-CM | POA: Diagnosis not present

## 2020-08-26 DIAGNOSIS — I4901 Ventricular fibrillation: Secondary | ICD-10-CM | POA: Diagnosis not present

## 2020-08-26 DIAGNOSIS — I482 Chronic atrial fibrillation, unspecified: Secondary | ICD-10-CM

## 2020-08-26 DIAGNOSIS — J9621 Acute and chronic respiratory failure with hypoxia: Secondary | ICD-10-CM | POA: Diagnosis not present

## 2020-09-03 DIAGNOSIS — I509 Heart failure, unspecified: Secondary | ICD-10-CM

## 2020-09-03 DIAGNOSIS — I4901 Ventricular fibrillation: Secondary | ICD-10-CM | POA: Diagnosis not present

## 2020-09-03 DIAGNOSIS — I482 Chronic atrial fibrillation, unspecified: Secondary | ICD-10-CM

## 2020-09-03 DIAGNOSIS — K922 Gastrointestinal hemorrhage, unspecified: Secondary | ICD-10-CM | POA: Diagnosis not present

## 2020-09-03 DIAGNOSIS — J698 Pneumonitis due to inhalation of other solids and liquids: Secondary | ICD-10-CM | POA: Diagnosis not present

## 2020-09-03 DIAGNOSIS — J9621 Acute and chronic respiratory failure with hypoxia: Secondary | ICD-10-CM | POA: Diagnosis not present

## 2020-09-04 DIAGNOSIS — I482 Chronic atrial fibrillation, unspecified: Secondary | ICD-10-CM

## 2020-09-04 DIAGNOSIS — J698 Pneumonitis due to inhalation of other solids and liquids: Secondary | ICD-10-CM

## 2020-09-04 DIAGNOSIS — J9621 Acute and chronic respiratory failure with hypoxia: Secondary | ICD-10-CM

## 2020-09-04 DIAGNOSIS — K922 Gastrointestinal hemorrhage, unspecified: Secondary | ICD-10-CM

## 2020-09-04 DIAGNOSIS — I509 Heart failure, unspecified: Secondary | ICD-10-CM

## 2020-09-04 DIAGNOSIS — I4901 Ventricular fibrillation: Secondary | ICD-10-CM

## 2020-09-05 DIAGNOSIS — I4901 Ventricular fibrillation: Secondary | ICD-10-CM

## 2020-09-05 DIAGNOSIS — I509 Heart failure, unspecified: Secondary | ICD-10-CM

## 2020-09-05 DIAGNOSIS — J698 Pneumonitis due to inhalation of other solids and liquids: Secondary | ICD-10-CM

## 2020-09-05 DIAGNOSIS — K922 Gastrointestinal hemorrhage, unspecified: Secondary | ICD-10-CM

## 2020-09-05 DIAGNOSIS — I482 Chronic atrial fibrillation, unspecified: Secondary | ICD-10-CM

## 2020-09-05 DIAGNOSIS — J9621 Acute and chronic respiratory failure with hypoxia: Secondary | ICD-10-CM

## 2020-09-06 DIAGNOSIS — I482 Chronic atrial fibrillation, unspecified: Secondary | ICD-10-CM

## 2020-09-06 DIAGNOSIS — K922 Gastrointestinal hemorrhage, unspecified: Secondary | ICD-10-CM

## 2020-09-06 DIAGNOSIS — I509 Heart failure, unspecified: Secondary | ICD-10-CM

## 2020-09-06 DIAGNOSIS — J698 Pneumonitis due to inhalation of other solids and liquids: Secondary | ICD-10-CM

## 2020-09-06 DIAGNOSIS — I4901 Ventricular fibrillation: Secondary | ICD-10-CM

## 2020-09-06 DIAGNOSIS — J9621 Acute and chronic respiratory failure with hypoxia: Secondary | ICD-10-CM

## 2020-09-07 DIAGNOSIS — I509 Heart failure, unspecified: Secondary | ICD-10-CM

## 2020-09-07 DIAGNOSIS — I4901 Ventricular fibrillation: Secondary | ICD-10-CM | POA: Diagnosis not present

## 2020-09-07 DIAGNOSIS — J9621 Acute and chronic respiratory failure with hypoxia: Secondary | ICD-10-CM | POA: Diagnosis not present

## 2020-09-07 DIAGNOSIS — J698 Pneumonitis due to inhalation of other solids and liquids: Secondary | ICD-10-CM | POA: Diagnosis not present

## 2020-09-07 DIAGNOSIS — K922 Gastrointestinal hemorrhage, unspecified: Secondary | ICD-10-CM | POA: Diagnosis not present

## 2020-09-07 DIAGNOSIS — I482 Chronic atrial fibrillation, unspecified: Secondary | ICD-10-CM

## 2020-09-08 DIAGNOSIS — J9621 Acute and chronic respiratory failure with hypoxia: Secondary | ICD-10-CM | POA: Diagnosis not present

## 2020-09-08 DIAGNOSIS — I4901 Ventricular fibrillation: Secondary | ICD-10-CM | POA: Diagnosis not present

## 2020-09-08 DIAGNOSIS — I509 Heart failure, unspecified: Secondary | ICD-10-CM

## 2020-09-08 DIAGNOSIS — J698 Pneumonitis due to inhalation of other solids and liquids: Secondary | ICD-10-CM | POA: Diagnosis not present

## 2020-09-08 DIAGNOSIS — K922 Gastrointestinal hemorrhage, unspecified: Secondary | ICD-10-CM | POA: Diagnosis not present

## 2020-09-08 DIAGNOSIS — I482 Chronic atrial fibrillation, unspecified: Secondary | ICD-10-CM

## 2020-09-09 DIAGNOSIS — I4901 Ventricular fibrillation: Secondary | ICD-10-CM | POA: Diagnosis not present

## 2020-09-09 DIAGNOSIS — I482 Chronic atrial fibrillation, unspecified: Secondary | ICD-10-CM

## 2020-09-09 DIAGNOSIS — I509 Heart failure, unspecified: Secondary | ICD-10-CM

## 2020-09-09 DIAGNOSIS — J9621 Acute and chronic respiratory failure with hypoxia: Secondary | ICD-10-CM | POA: Diagnosis not present

## 2020-09-09 DIAGNOSIS — J698 Pneumonitis due to inhalation of other solids and liquids: Secondary | ICD-10-CM | POA: Diagnosis not present

## 2020-09-09 DIAGNOSIS — K922 Gastrointestinal hemorrhage, unspecified: Secondary | ICD-10-CM | POA: Diagnosis not present

## 2020-09-17 DIAGNOSIS — I4901 Ventricular fibrillation: Secondary | ICD-10-CM | POA: Diagnosis not present

## 2020-09-17 DIAGNOSIS — K922 Gastrointestinal hemorrhage, unspecified: Secondary | ICD-10-CM | POA: Diagnosis not present

## 2020-09-17 DIAGNOSIS — I509 Heart failure, unspecified: Secondary | ICD-10-CM

## 2020-09-17 DIAGNOSIS — J698 Pneumonitis due to inhalation of other solids and liquids: Secondary | ICD-10-CM | POA: Diagnosis not present

## 2020-09-17 DIAGNOSIS — J9621 Acute and chronic respiratory failure with hypoxia: Secondary | ICD-10-CM | POA: Diagnosis not present

## 2020-09-17 DIAGNOSIS — I482 Chronic atrial fibrillation, unspecified: Secondary | ICD-10-CM

## 2020-09-18 DIAGNOSIS — K922 Gastrointestinal hemorrhage, unspecified: Secondary | ICD-10-CM | POA: Diagnosis not present

## 2020-09-18 DIAGNOSIS — J698 Pneumonitis due to inhalation of other solids and liquids: Secondary | ICD-10-CM | POA: Diagnosis not present

## 2020-09-18 DIAGNOSIS — I509 Heart failure, unspecified: Secondary | ICD-10-CM

## 2020-09-18 DIAGNOSIS — J9621 Acute and chronic respiratory failure with hypoxia: Secondary | ICD-10-CM | POA: Diagnosis not present

## 2020-09-18 DIAGNOSIS — I482 Chronic atrial fibrillation, unspecified: Secondary | ICD-10-CM

## 2020-09-18 DIAGNOSIS — I4901 Ventricular fibrillation: Secondary | ICD-10-CM | POA: Diagnosis not present

## 2020-09-19 DIAGNOSIS — J9621 Acute and chronic respiratory failure with hypoxia: Secondary | ICD-10-CM | POA: Diagnosis not present

## 2020-09-19 DIAGNOSIS — I4901 Ventricular fibrillation: Secondary | ICD-10-CM | POA: Diagnosis not present

## 2020-09-19 DIAGNOSIS — I482 Chronic atrial fibrillation, unspecified: Secondary | ICD-10-CM

## 2020-09-19 DIAGNOSIS — I509 Heart failure, unspecified: Secondary | ICD-10-CM

## 2020-09-19 DIAGNOSIS — J698 Pneumonitis due to inhalation of other solids and liquids: Secondary | ICD-10-CM | POA: Diagnosis not present

## 2020-09-19 DIAGNOSIS — K922 Gastrointestinal hemorrhage, unspecified: Secondary | ICD-10-CM | POA: Diagnosis not present

## 2020-09-20 DIAGNOSIS — K922 Gastrointestinal hemorrhage, unspecified: Secondary | ICD-10-CM | POA: Diagnosis not present

## 2020-09-20 DIAGNOSIS — J698 Pneumonitis due to inhalation of other solids and liquids: Secondary | ICD-10-CM | POA: Diagnosis not present

## 2020-09-20 DIAGNOSIS — J9621 Acute and chronic respiratory failure with hypoxia: Secondary | ICD-10-CM | POA: Diagnosis not present

## 2020-09-20 DIAGNOSIS — I509 Heart failure, unspecified: Secondary | ICD-10-CM

## 2020-09-20 DIAGNOSIS — I4901 Ventricular fibrillation: Secondary | ICD-10-CM | POA: Diagnosis not present

## 2020-09-20 DIAGNOSIS — I482 Chronic atrial fibrillation, unspecified: Secondary | ICD-10-CM

## 2020-09-21 DIAGNOSIS — I4901 Ventricular fibrillation: Secondary | ICD-10-CM | POA: Diagnosis not present

## 2020-09-21 DIAGNOSIS — J698 Pneumonitis due to inhalation of other solids and liquids: Secondary | ICD-10-CM

## 2020-09-21 DIAGNOSIS — J9621 Acute and chronic respiratory failure with hypoxia: Secondary | ICD-10-CM | POA: Diagnosis not present

## 2020-09-21 DIAGNOSIS — I482 Chronic atrial fibrillation, unspecified: Secondary | ICD-10-CM

## 2020-09-21 DIAGNOSIS — I509 Heart failure, unspecified: Secondary | ICD-10-CM

## 2020-09-21 DIAGNOSIS — K922 Gastrointestinal hemorrhage, unspecified: Secondary | ICD-10-CM

## 2020-09-22 DIAGNOSIS — I482 Chronic atrial fibrillation, unspecified: Secondary | ICD-10-CM

## 2020-09-22 DIAGNOSIS — J9621 Acute and chronic respiratory failure with hypoxia: Secondary | ICD-10-CM

## 2020-09-22 DIAGNOSIS — I509 Heart failure, unspecified: Secondary | ICD-10-CM

## 2020-09-22 DIAGNOSIS — K922 Gastrointestinal hemorrhage, unspecified: Secondary | ICD-10-CM

## 2020-09-22 DIAGNOSIS — J698 Pneumonitis due to inhalation of other solids and liquids: Secondary | ICD-10-CM

## 2020-09-22 DIAGNOSIS — I4901 Ventricular fibrillation: Secondary | ICD-10-CM | POA: Diagnosis not present

## 2020-09-23 DIAGNOSIS — I509 Heart failure, unspecified: Secondary | ICD-10-CM

## 2020-09-23 DIAGNOSIS — I482 Chronic atrial fibrillation, unspecified: Secondary | ICD-10-CM

## 2020-09-23 DIAGNOSIS — J698 Pneumonitis due to inhalation of other solids and liquids: Secondary | ICD-10-CM

## 2020-09-23 DIAGNOSIS — J9621 Acute and chronic respiratory failure with hypoxia: Secondary | ICD-10-CM | POA: Diagnosis not present

## 2020-09-23 DIAGNOSIS — K922 Gastrointestinal hemorrhage, unspecified: Secondary | ICD-10-CM | POA: Diagnosis not present

## 2020-09-23 DIAGNOSIS — I4901 Ventricular fibrillation: Secondary | ICD-10-CM

## 2020-10-01 DIAGNOSIS — K922 Gastrointestinal hemorrhage, unspecified: Secondary | ICD-10-CM

## 2020-10-01 DIAGNOSIS — I482 Chronic atrial fibrillation, unspecified: Secondary | ICD-10-CM

## 2020-10-01 DIAGNOSIS — I509 Heart failure, unspecified: Secondary | ICD-10-CM

## 2020-10-01 DIAGNOSIS — J9621 Acute and chronic respiratory failure with hypoxia: Secondary | ICD-10-CM | POA: Diagnosis not present

## 2020-10-01 DIAGNOSIS — J698 Pneumonitis due to inhalation of other solids and liquids: Secondary | ICD-10-CM

## 2020-10-01 DIAGNOSIS — I4901 Ventricular fibrillation: Secondary | ICD-10-CM

## 2020-10-02 DIAGNOSIS — I509 Heart failure, unspecified: Secondary | ICD-10-CM

## 2020-10-02 DIAGNOSIS — I4901 Ventricular fibrillation: Secondary | ICD-10-CM

## 2020-10-02 DIAGNOSIS — J698 Pneumonitis due to inhalation of other solids and liquids: Secondary | ICD-10-CM | POA: Diagnosis not present

## 2020-10-02 DIAGNOSIS — J9621 Acute and chronic respiratory failure with hypoxia: Secondary | ICD-10-CM

## 2020-10-02 DIAGNOSIS — I482 Chronic atrial fibrillation, unspecified: Secondary | ICD-10-CM

## 2020-10-02 DIAGNOSIS — K922 Gastrointestinal hemorrhage, unspecified: Secondary | ICD-10-CM | POA: Diagnosis not present

## 2020-10-03 DIAGNOSIS — I4901 Ventricular fibrillation: Secondary | ICD-10-CM

## 2020-10-03 DIAGNOSIS — J698 Pneumonitis due to inhalation of other solids and liquids: Secondary | ICD-10-CM

## 2020-10-03 DIAGNOSIS — I482 Chronic atrial fibrillation, unspecified: Secondary | ICD-10-CM

## 2020-10-03 DIAGNOSIS — I509 Heart failure, unspecified: Secondary | ICD-10-CM

## 2020-10-03 DIAGNOSIS — K922 Gastrointestinal hemorrhage, unspecified: Secondary | ICD-10-CM

## 2020-10-03 DIAGNOSIS — J9621 Acute and chronic respiratory failure with hypoxia: Secondary | ICD-10-CM

## 2020-10-04 DIAGNOSIS — I4901 Ventricular fibrillation: Secondary | ICD-10-CM

## 2020-10-04 DIAGNOSIS — J9621 Acute and chronic respiratory failure with hypoxia: Secondary | ICD-10-CM

## 2020-10-04 DIAGNOSIS — I509 Heart failure, unspecified: Secondary | ICD-10-CM

## 2020-10-04 DIAGNOSIS — J698 Pneumonitis due to inhalation of other solids and liquids: Secondary | ICD-10-CM | POA: Diagnosis not present

## 2020-10-04 DIAGNOSIS — K922 Gastrointestinal hemorrhage, unspecified: Secondary | ICD-10-CM

## 2020-10-04 DIAGNOSIS — I482 Chronic atrial fibrillation, unspecified: Secondary | ICD-10-CM

## 2020-10-05 DIAGNOSIS — I482 Chronic atrial fibrillation, unspecified: Secondary | ICD-10-CM

## 2020-10-05 DIAGNOSIS — J698 Pneumonitis due to inhalation of other solids and liquids: Secondary | ICD-10-CM

## 2020-10-05 DIAGNOSIS — I4901 Ventricular fibrillation: Secondary | ICD-10-CM

## 2020-10-05 DIAGNOSIS — K922 Gastrointestinal hemorrhage, unspecified: Secondary | ICD-10-CM

## 2020-10-05 DIAGNOSIS — I509 Heart failure, unspecified: Secondary | ICD-10-CM

## 2020-10-05 DIAGNOSIS — J9621 Acute and chronic respiratory failure with hypoxia: Secondary | ICD-10-CM

## 2020-10-06 DIAGNOSIS — I509 Heart failure, unspecified: Secondary | ICD-10-CM

## 2020-10-06 DIAGNOSIS — I482 Chronic atrial fibrillation, unspecified: Secondary | ICD-10-CM

## 2020-10-06 DIAGNOSIS — J698 Pneumonitis due to inhalation of other solids and liquids: Secondary | ICD-10-CM

## 2020-10-06 DIAGNOSIS — K922 Gastrointestinal hemorrhage, unspecified: Secondary | ICD-10-CM

## 2020-10-06 DIAGNOSIS — I4901 Ventricular fibrillation: Secondary | ICD-10-CM

## 2020-10-06 DIAGNOSIS — J9621 Acute and chronic respiratory failure with hypoxia: Secondary | ICD-10-CM

## 2020-10-07 DIAGNOSIS — K922 Gastrointestinal hemorrhage, unspecified: Secondary | ICD-10-CM

## 2020-10-07 DIAGNOSIS — I509 Heart failure, unspecified: Secondary | ICD-10-CM

## 2020-10-07 DIAGNOSIS — J9621 Acute and chronic respiratory failure with hypoxia: Secondary | ICD-10-CM | POA: Diagnosis not present

## 2020-10-07 DIAGNOSIS — I4901 Ventricular fibrillation: Secondary | ICD-10-CM

## 2020-10-07 DIAGNOSIS — J698 Pneumonitis due to inhalation of other solids and liquids: Secondary | ICD-10-CM

## 2020-10-07 DIAGNOSIS — I482 Chronic atrial fibrillation, unspecified: Secondary | ICD-10-CM

## 2020-10-15 DIAGNOSIS — J9621 Acute and chronic respiratory failure with hypoxia: Secondary | ICD-10-CM | POA: Diagnosis not present

## 2020-10-15 DIAGNOSIS — I4901 Ventricular fibrillation: Secondary | ICD-10-CM | POA: Diagnosis not present

## 2020-10-15 DIAGNOSIS — I509 Heart failure, unspecified: Secondary | ICD-10-CM

## 2020-10-15 DIAGNOSIS — J698 Pneumonitis due to inhalation of other solids and liquids: Secondary | ICD-10-CM | POA: Diagnosis not present

## 2020-10-15 DIAGNOSIS — K922 Gastrointestinal hemorrhage, unspecified: Secondary | ICD-10-CM | POA: Diagnosis not present

## 2020-10-15 DIAGNOSIS — I482 Chronic atrial fibrillation, unspecified: Secondary | ICD-10-CM

## 2020-10-16 DIAGNOSIS — I482 Chronic atrial fibrillation, unspecified: Secondary | ICD-10-CM

## 2020-10-16 DIAGNOSIS — J698 Pneumonitis due to inhalation of other solids and liquids: Secondary | ICD-10-CM | POA: Diagnosis not present

## 2020-10-16 DIAGNOSIS — I4901 Ventricular fibrillation: Secondary | ICD-10-CM | POA: Diagnosis not present

## 2020-10-16 DIAGNOSIS — I509 Heart failure, unspecified: Secondary | ICD-10-CM

## 2020-10-16 DIAGNOSIS — J9621 Acute and chronic respiratory failure with hypoxia: Secondary | ICD-10-CM | POA: Diagnosis not present

## 2020-10-16 DIAGNOSIS — K922 Gastrointestinal hemorrhage, unspecified: Secondary | ICD-10-CM | POA: Diagnosis not present

## 2020-10-17 DIAGNOSIS — K922 Gastrointestinal hemorrhage, unspecified: Secondary | ICD-10-CM | POA: Diagnosis not present

## 2020-10-17 DIAGNOSIS — I4901 Ventricular fibrillation: Secondary | ICD-10-CM | POA: Diagnosis not present

## 2020-10-17 DIAGNOSIS — J9621 Acute and chronic respiratory failure with hypoxia: Secondary | ICD-10-CM | POA: Diagnosis not present

## 2020-10-17 DIAGNOSIS — I482 Chronic atrial fibrillation, unspecified: Secondary | ICD-10-CM

## 2020-10-17 DIAGNOSIS — J698 Pneumonitis due to inhalation of other solids and liquids: Secondary | ICD-10-CM | POA: Diagnosis not present

## 2020-10-17 DIAGNOSIS — I509 Heart failure, unspecified: Secondary | ICD-10-CM

## 2020-10-18 DIAGNOSIS — I509 Heart failure, unspecified: Secondary | ICD-10-CM

## 2020-10-18 DIAGNOSIS — J9621 Acute and chronic respiratory failure with hypoxia: Secondary | ICD-10-CM | POA: Diagnosis not present

## 2020-10-18 DIAGNOSIS — I4901 Ventricular fibrillation: Secondary | ICD-10-CM | POA: Diagnosis not present

## 2020-10-18 DIAGNOSIS — K922 Gastrointestinal hemorrhage, unspecified: Secondary | ICD-10-CM | POA: Diagnosis not present

## 2020-10-18 DIAGNOSIS — J698 Pneumonitis due to inhalation of other solids and liquids: Secondary | ICD-10-CM | POA: Diagnosis not present

## 2020-10-18 DIAGNOSIS — I482 Chronic atrial fibrillation, unspecified: Secondary | ICD-10-CM

## 2020-10-19 DIAGNOSIS — I509 Heart failure, unspecified: Secondary | ICD-10-CM

## 2020-10-19 DIAGNOSIS — I4901 Ventricular fibrillation: Secondary | ICD-10-CM | POA: Diagnosis not present

## 2020-10-19 DIAGNOSIS — J698 Pneumonitis due to inhalation of other solids and liquids: Secondary | ICD-10-CM | POA: Diagnosis not present

## 2020-10-19 DIAGNOSIS — J9621 Acute and chronic respiratory failure with hypoxia: Secondary | ICD-10-CM | POA: Diagnosis not present

## 2020-10-19 DIAGNOSIS — K922 Gastrointestinal hemorrhage, unspecified: Secondary | ICD-10-CM | POA: Diagnosis not present

## 2020-10-19 DIAGNOSIS — I482 Chronic atrial fibrillation, unspecified: Secondary | ICD-10-CM

## 2020-10-20 DIAGNOSIS — J698 Pneumonitis due to inhalation of other solids and liquids: Secondary | ICD-10-CM | POA: Diagnosis not present

## 2020-10-20 DIAGNOSIS — I482 Chronic atrial fibrillation, unspecified: Secondary | ICD-10-CM

## 2020-10-20 DIAGNOSIS — I509 Heart failure, unspecified: Secondary | ICD-10-CM

## 2020-10-20 DIAGNOSIS — K922 Gastrointestinal hemorrhage, unspecified: Secondary | ICD-10-CM | POA: Diagnosis not present

## 2020-10-20 DIAGNOSIS — J9621 Acute and chronic respiratory failure with hypoxia: Secondary | ICD-10-CM | POA: Diagnosis not present

## 2020-10-20 DIAGNOSIS — I4901 Ventricular fibrillation: Secondary | ICD-10-CM | POA: Diagnosis not present

## 2020-10-21 DIAGNOSIS — J698 Pneumonitis due to inhalation of other solids and liquids: Secondary | ICD-10-CM | POA: Diagnosis not present

## 2020-10-21 DIAGNOSIS — I509 Heart failure, unspecified: Secondary | ICD-10-CM

## 2020-10-21 DIAGNOSIS — I482 Chronic atrial fibrillation, unspecified: Secondary | ICD-10-CM

## 2020-10-21 DIAGNOSIS — J9621 Acute and chronic respiratory failure with hypoxia: Secondary | ICD-10-CM | POA: Diagnosis not present

## 2020-10-21 DIAGNOSIS — K922 Gastrointestinal hemorrhage, unspecified: Secondary | ICD-10-CM | POA: Diagnosis not present

## 2020-10-21 DIAGNOSIS — I4901 Ventricular fibrillation: Secondary | ICD-10-CM | POA: Diagnosis not present

## 2020-10-29 DIAGNOSIS — I482 Chronic atrial fibrillation, unspecified: Secondary | ICD-10-CM

## 2020-10-29 DIAGNOSIS — K922 Gastrointestinal hemorrhage, unspecified: Secondary | ICD-10-CM

## 2020-10-29 DIAGNOSIS — I509 Heart failure, unspecified: Secondary | ICD-10-CM

## 2020-10-29 DIAGNOSIS — J9621 Acute and chronic respiratory failure with hypoxia: Secondary | ICD-10-CM

## 2020-10-29 DIAGNOSIS — J698 Pneumonitis due to inhalation of other solids and liquids: Secondary | ICD-10-CM

## 2020-10-29 DIAGNOSIS — I491 Atrial premature depolarization: Secondary | ICD-10-CM

## 2020-10-30 DIAGNOSIS — J698 Pneumonitis due to inhalation of other solids and liquids: Secondary | ICD-10-CM

## 2020-10-30 DIAGNOSIS — I509 Heart failure, unspecified: Secondary | ICD-10-CM

## 2020-10-30 DIAGNOSIS — I482 Chronic atrial fibrillation, unspecified: Secondary | ICD-10-CM

## 2020-10-30 DIAGNOSIS — I4901 Ventricular fibrillation: Secondary | ICD-10-CM

## 2020-10-30 DIAGNOSIS — J9621 Acute and chronic respiratory failure with hypoxia: Secondary | ICD-10-CM

## 2020-10-30 DIAGNOSIS — K922 Gastrointestinal hemorrhage, unspecified: Secondary | ICD-10-CM

## 2020-10-31 DIAGNOSIS — K922 Gastrointestinal hemorrhage, unspecified: Secondary | ICD-10-CM

## 2020-10-31 DIAGNOSIS — J698 Pneumonitis due to inhalation of other solids and liquids: Secondary | ICD-10-CM

## 2020-10-31 DIAGNOSIS — I482 Chronic atrial fibrillation, unspecified: Secondary | ICD-10-CM

## 2020-10-31 DIAGNOSIS — J9621 Acute and chronic respiratory failure with hypoxia: Secondary | ICD-10-CM

## 2020-10-31 DIAGNOSIS — I509 Heart failure, unspecified: Secondary | ICD-10-CM

## 2020-11-01 DIAGNOSIS — J9621 Acute and chronic respiratory failure with hypoxia: Secondary | ICD-10-CM

## 2020-11-01 DIAGNOSIS — I4901 Ventricular fibrillation: Secondary | ICD-10-CM

## 2020-11-01 DIAGNOSIS — K922 Gastrointestinal hemorrhage, unspecified: Secondary | ICD-10-CM

## 2020-11-01 DIAGNOSIS — J698 Pneumonitis due to inhalation of other solids and liquids: Secondary | ICD-10-CM

## 2020-11-01 DIAGNOSIS — I482 Chronic atrial fibrillation, unspecified: Secondary | ICD-10-CM

## 2020-11-01 DIAGNOSIS — I509 Heart failure, unspecified: Secondary | ICD-10-CM

## 2020-11-02 DIAGNOSIS — J698 Pneumonitis due to inhalation of other solids and liquids: Secondary | ICD-10-CM

## 2020-11-02 DIAGNOSIS — I482 Chronic atrial fibrillation, unspecified: Secondary | ICD-10-CM

## 2020-11-02 DIAGNOSIS — I509 Heart failure, unspecified: Secondary | ICD-10-CM

## 2020-11-02 DIAGNOSIS — K922 Gastrointestinal hemorrhage, unspecified: Secondary | ICD-10-CM

## 2020-11-02 DIAGNOSIS — J9621 Acute and chronic respiratory failure with hypoxia: Secondary | ICD-10-CM

## 2020-11-02 DIAGNOSIS — I4901 Ventricular fibrillation: Secondary | ICD-10-CM

## 2020-11-03 DIAGNOSIS — K922 Gastrointestinal hemorrhage, unspecified: Secondary | ICD-10-CM

## 2020-11-03 DIAGNOSIS — I509 Heart failure, unspecified: Secondary | ICD-10-CM

## 2020-11-03 DIAGNOSIS — J9621 Acute and chronic respiratory failure with hypoxia: Secondary | ICD-10-CM

## 2020-11-03 DIAGNOSIS — I482 Chronic atrial fibrillation, unspecified: Secondary | ICD-10-CM

## 2020-11-03 DIAGNOSIS — I4901 Ventricular fibrillation: Secondary | ICD-10-CM

## 2020-11-03 DIAGNOSIS — J698 Pneumonitis due to inhalation of other solids and liquids: Secondary | ICD-10-CM

## 2020-11-04 DIAGNOSIS — J9621 Acute and chronic respiratory failure with hypoxia: Secondary | ICD-10-CM

## 2020-11-04 DIAGNOSIS — I509 Heart failure, unspecified: Secondary | ICD-10-CM

## 2020-11-04 DIAGNOSIS — I4901 Ventricular fibrillation: Secondary | ICD-10-CM

## 2020-11-04 DIAGNOSIS — I482 Chronic atrial fibrillation, unspecified: Secondary | ICD-10-CM

## 2020-11-04 DIAGNOSIS — J698 Pneumonitis due to inhalation of other solids and liquids: Secondary | ICD-10-CM

## 2020-11-04 DIAGNOSIS — K922 Gastrointestinal hemorrhage, unspecified: Secondary | ICD-10-CM

## 2020-11-10 DIAGNOSIS — J698 Pneumonitis due to inhalation of other solids and liquids: Secondary | ICD-10-CM

## 2020-11-10 DIAGNOSIS — K922 Gastrointestinal hemorrhage, unspecified: Secondary | ICD-10-CM

## 2020-11-10 DIAGNOSIS — J9621 Acute and chronic respiratory failure with hypoxia: Secondary | ICD-10-CM

## 2020-11-10 DIAGNOSIS — I482 Chronic atrial fibrillation, unspecified: Secondary | ICD-10-CM

## 2020-11-10 DIAGNOSIS — I509 Heart failure, unspecified: Secondary | ICD-10-CM

## 2020-11-11 DIAGNOSIS — I509 Heart failure, unspecified: Secondary | ICD-10-CM

## 2020-11-11 DIAGNOSIS — K922 Gastrointestinal hemorrhage, unspecified: Secondary | ICD-10-CM

## 2020-11-11 DIAGNOSIS — J9621 Acute and chronic respiratory failure with hypoxia: Secondary | ICD-10-CM

## 2020-11-11 DIAGNOSIS — J698 Pneumonitis due to inhalation of other solids and liquids: Secondary | ICD-10-CM

## 2020-11-11 DIAGNOSIS — I482 Chronic atrial fibrillation, unspecified: Secondary | ICD-10-CM

## 2020-11-12 DIAGNOSIS — J698 Pneumonitis due to inhalation of other solids and liquids: Secondary | ICD-10-CM

## 2020-11-12 DIAGNOSIS — I509 Heart failure, unspecified: Secondary | ICD-10-CM

## 2020-11-12 DIAGNOSIS — K922 Gastrointestinal hemorrhage, unspecified: Secondary | ICD-10-CM

## 2020-11-12 DIAGNOSIS — I482 Chronic atrial fibrillation, unspecified: Secondary | ICD-10-CM

## 2020-11-12 DIAGNOSIS — J9621 Acute and chronic respiratory failure with hypoxia: Secondary | ICD-10-CM

## 2020-11-13 DIAGNOSIS — J9621 Acute and chronic respiratory failure with hypoxia: Secondary | ICD-10-CM

## 2020-11-13 DIAGNOSIS — I482 Chronic atrial fibrillation, unspecified: Secondary | ICD-10-CM

## 2020-11-13 DIAGNOSIS — I4901 Ventricular fibrillation: Secondary | ICD-10-CM

## 2020-11-13 DIAGNOSIS — I509 Heart failure, unspecified: Secondary | ICD-10-CM

## 2020-11-13 DIAGNOSIS — J698 Pneumonitis due to inhalation of other solids and liquids: Secondary | ICD-10-CM | POA: Diagnosis not present

## 2020-11-13 DIAGNOSIS — K922 Gastrointestinal hemorrhage, unspecified: Secondary | ICD-10-CM

## 2022-01-13 IMAGING — DX DG CHEST 1V PORT
1 series · 1 of 1 positions shown · non-contrast
Comparison: None.

CLINICAL DATA: Shortness of breath.

EXAM:
PORTABLE CHEST 1 VIEW

[chest ap]
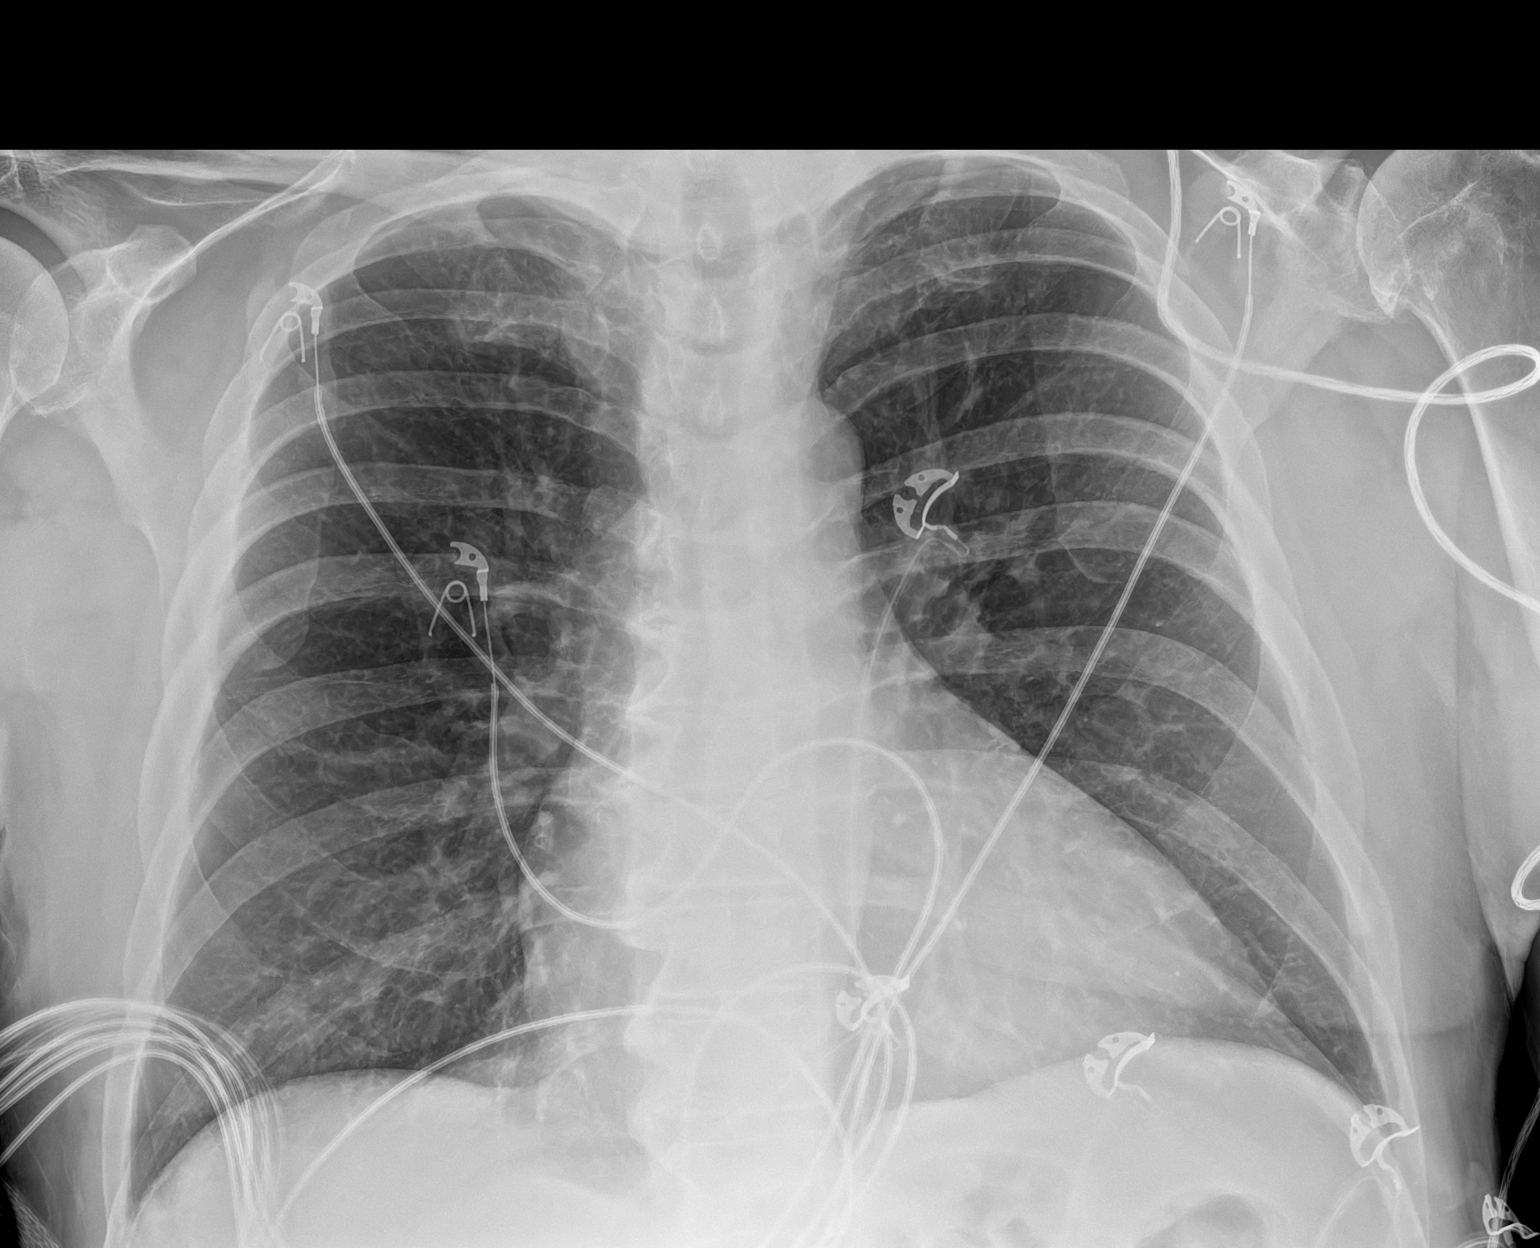

[1 of 1 positions shown; findings below may reference images not displayed]

FINDINGS: Borderline enlargement of the cardiac silhouette. Both lungs are
clear. No visible pleural effusions or pneumothorax. The visualized
skeletal structures are unremarkable.
IMPRESSION: 1. No acute cardiopulmonary disease.
2. Borderline cardiomegaly.

## 2022-01-13 IMAGING — CT CT ANGIO CHEST
2 of 6 series · 17 of 46 positions shown · IV contrast (Omnipaque or Isovue)
Comparison: None.

CLINICAL DATA: Pulmonary embolism, dizziness, weakness, cough

EXAM:
CT ANGIOGRAPHY CHEST WITH CONTRAST
TECHNIQUE: Multidetector CT imaging of the chest was performed using the
standard protocol during bolus administration of intravenous
contrast. Multiplanar CT image reconstructions and MIPs were
obtained to evaluate the vascular anatomy.
CONTRAST:  100mL OMNIPAQUE IOHEXOL 350 MG/ML SOLN

[Series 6: pe axial thins · axial · 0.69mm/px · z∈[-302,-21]mm · 14 of 309 slices shown]
[im 14/309  lung]
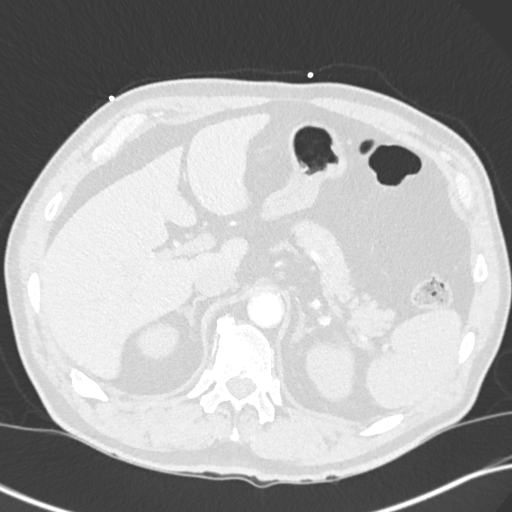
[im 41/309  soft-tissue]
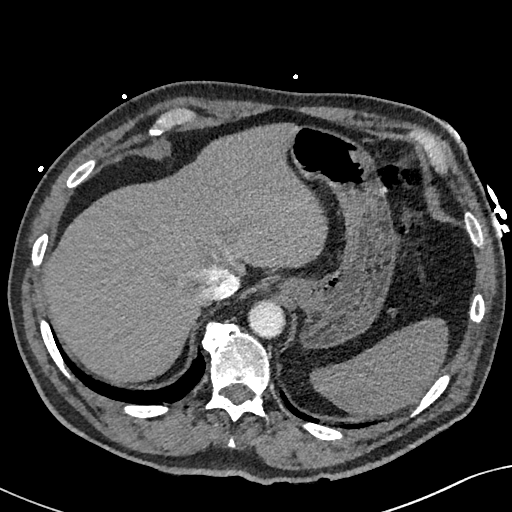
[im 54/309  lung]
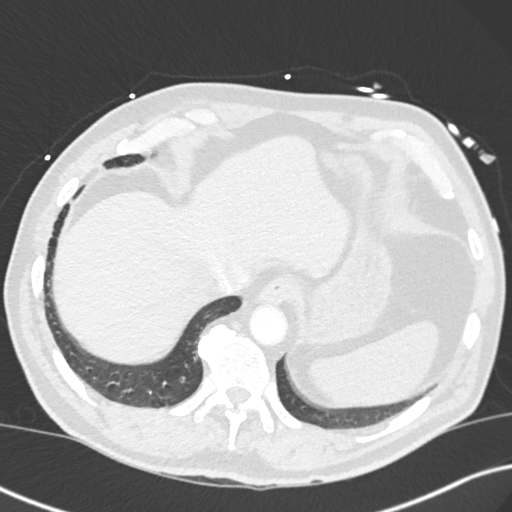
[im 81/309  soft-tissue]
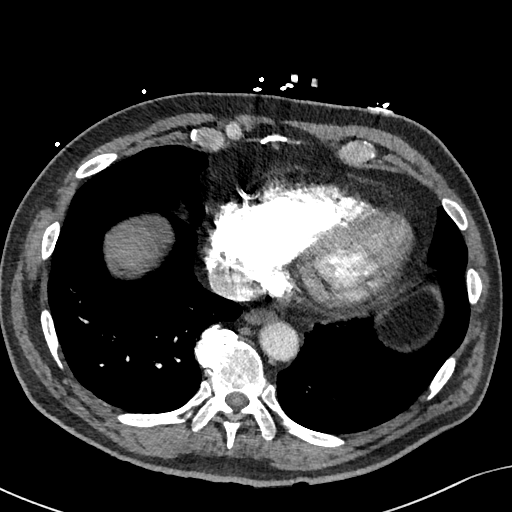
[im 108/309  lung]
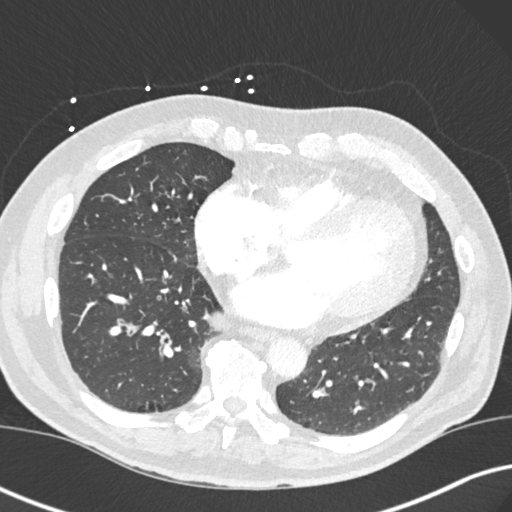
[im 121/309  soft-tissue]
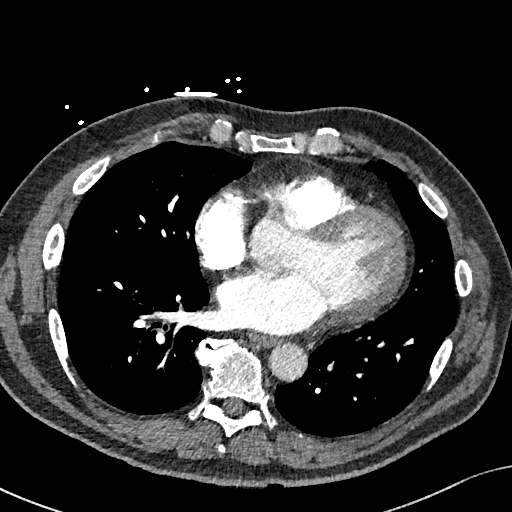
[im 148/309  lung]
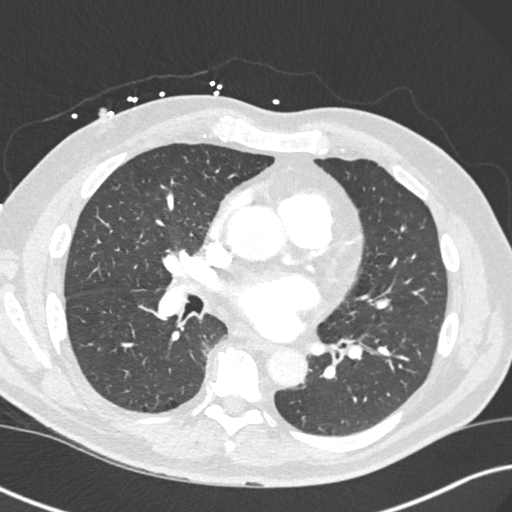
[im 161/309  soft-tissue]
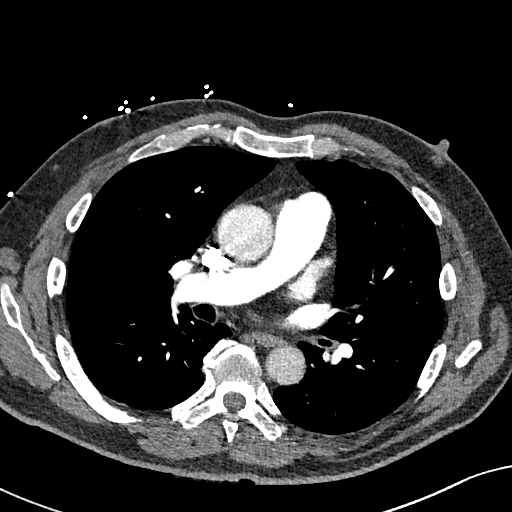
[im 188/309  lung]
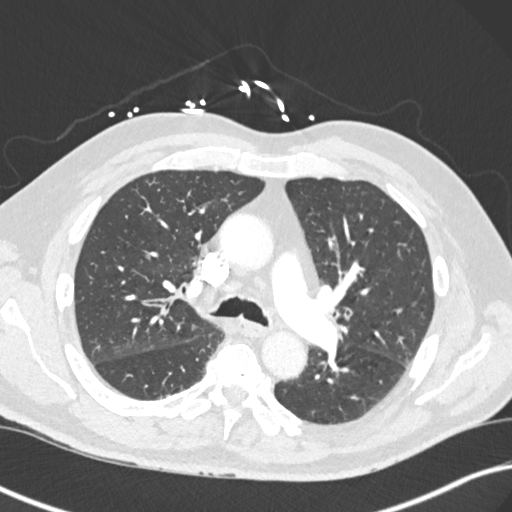
[im 201/309  soft-tissue]
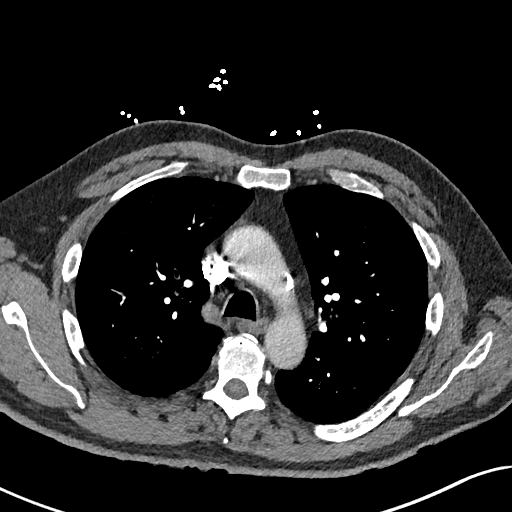
[im 228/309  lung]
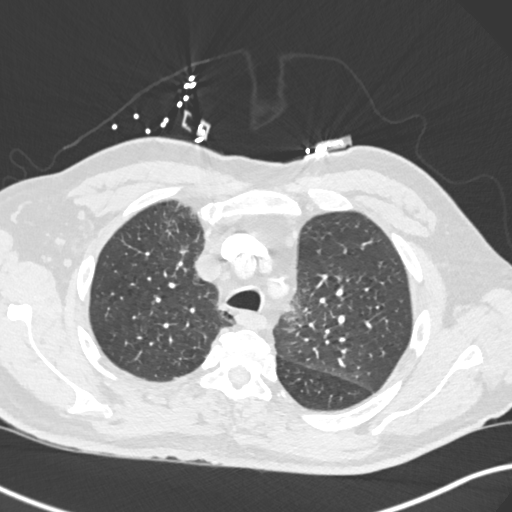
[im 255/309  soft-tissue]
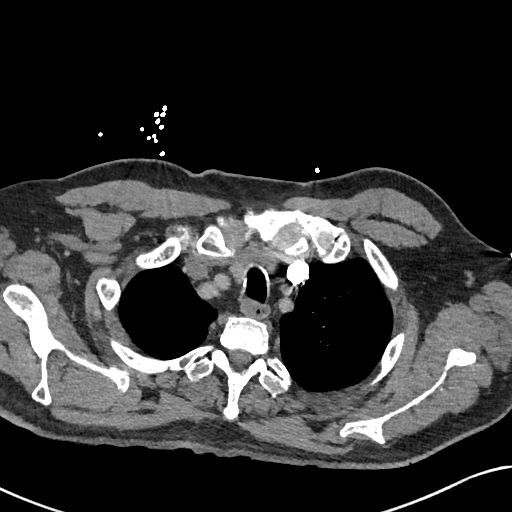
[im 268/309  lung]
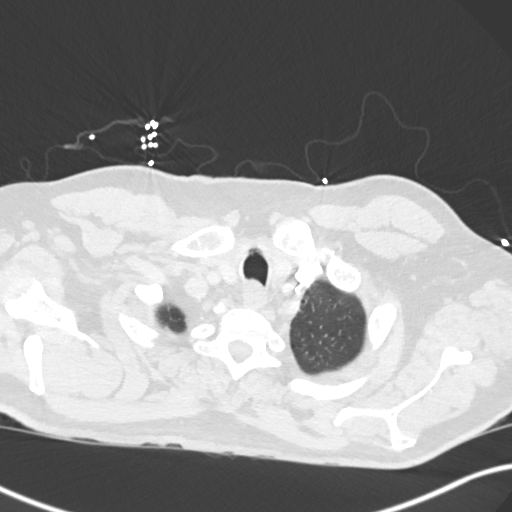
[im 295/309  soft-tissue]
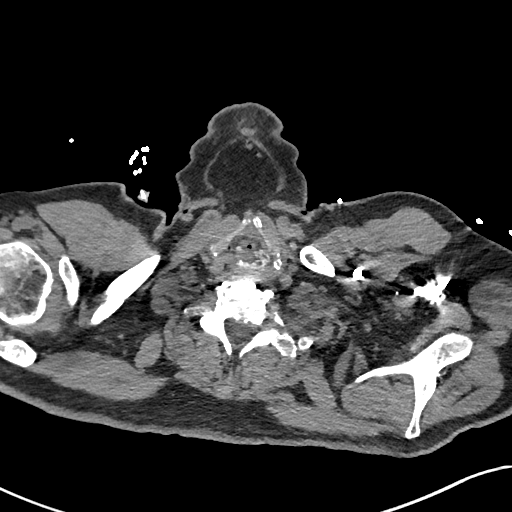

[Series 8: cor soft · coronal · 0.61mm/px · 3 of 135 slices shown]
[im 34/135  soft-tissue]
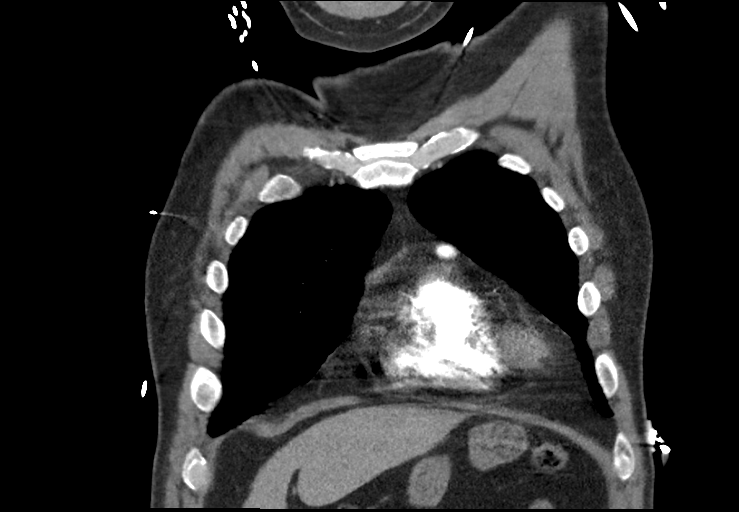
[im 68/135  soft-tissue]
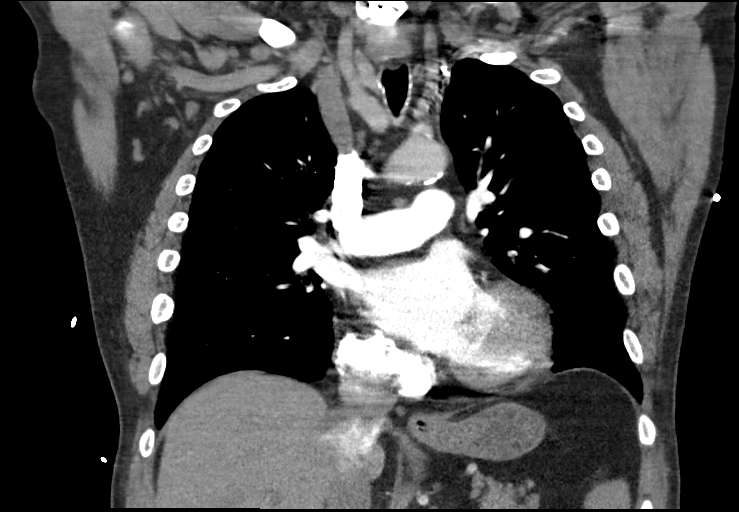
[im 101/135  soft-tissue]
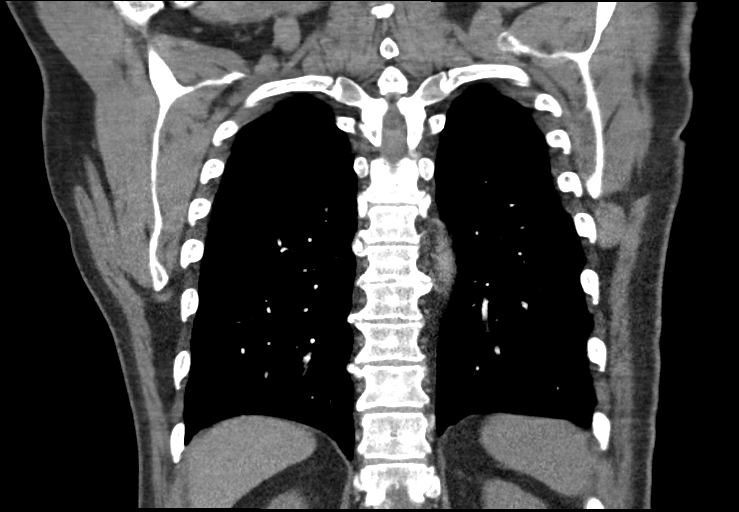

[17 of 46 positions shown; findings below may reference images not displayed]

FINDINGS: Cardiovascular: There is excellent opacification of the pulmonary
arterial tree. No intraluminal filling defect to suggest acute
pulmonary embolism. Central pulmonary arterial caliber is normal.
Minimal coronary artery calcification. Distal right coronary artery
has undergone stenting. Global cardiac size within normal limits.
Mild left ventricular hypertrophy. No pericardial effusion. Mild
atherosclerotic calcification is seen within the a aortic arch.
Thoracic aorta is otherwise unremarkable

Mediastinum/Nodes: Thyroid unremarkable. No pathologic mediastinal
adenopathy. Esophagus unremarkable.

Lungs/Pleura: Mild centrilobular emphysema. There is bronchial wall
thickening noted diffusely in keeping with airway inflammation no
confluent pulmonary infiltrates or focal pulmonary nodule. No
pneumothorax or pleural effusion. No central obstructing mass.

Upper Abdomen: No acute abnormality.

Musculoskeletal: Cervical fusion hardware partially visualized. No
acute bone abnormality.

Review of the MIP images confirms the above findings.
IMPRESSION: Mild centrilobular emphysema.  Superimposed airway inflammation.

No pulmonary embolism.

Aortic Atherosclerosis (NWPOC-8KV.V) and Emphysema (NWPOC-5RN.P).

## 2023-11-14 DEATH — deceased
